# Patient Record
Sex: Male | Born: 1945 | Race: White | Hispanic: No | Marital: Married | Smoking: Former smoker
Health system: Southern US, Community
[De-identification: ages and names within clinical notes are randomized; demographics above are authoritative.]

## PROBLEM LIST (undated history)

## (undated) DIAGNOSIS — E119 Type 2 diabetes mellitus without complications: Secondary | ICD-10-CM

## (undated) DIAGNOSIS — I10 Essential (primary) hypertension: Secondary | ICD-10-CM

## (undated) DIAGNOSIS — M4800 Spinal stenosis, site unspecified: Secondary | ICD-10-CM

## (undated) DIAGNOSIS — M545 Low back pain, unspecified: Secondary | ICD-10-CM

## (undated) DIAGNOSIS — N183 Chronic kidney disease, stage 3 unspecified: Secondary | ICD-10-CM

## (undated) DIAGNOSIS — C439 Malignant melanoma of skin, unspecified: Secondary | ICD-10-CM

## (undated) DIAGNOSIS — E7800 Pure hypercholesterolemia, unspecified: Secondary | ICD-10-CM

## (undated) DIAGNOSIS — I700 Atherosclerosis of aorta: Secondary | ICD-10-CM

## (undated) DIAGNOSIS — K5790 Diverticulosis of intestine, part unspecified, without perforation or abscess without bleeding: Secondary | ICD-10-CM

## (undated) DIAGNOSIS — G479 Sleep disorder, unspecified: Secondary | ICD-10-CM

## (undated) DIAGNOSIS — H9313 Tinnitus, bilateral: Secondary | ICD-10-CM

## (undated) DIAGNOSIS — E1142 Type 2 diabetes mellitus with diabetic polyneuropathy: Secondary | ICD-10-CM

## (undated) DIAGNOSIS — F4310 Post-traumatic stress disorder, unspecified: Secondary | ICD-10-CM

## (undated) DIAGNOSIS — H18519 Endothelial corneal dystrophy, unspecified eye: Secondary | ICD-10-CM

## (undated) DIAGNOSIS — E11610 Type 2 diabetes mellitus with diabetic neuropathic arthropathy: Secondary | ICD-10-CM

## (undated) DIAGNOSIS — H905 Unspecified sensorineural hearing loss: Secondary | ICD-10-CM

## (undated) DIAGNOSIS — I4891 Unspecified atrial fibrillation: Secondary | ICD-10-CM

## (undated) DIAGNOSIS — E785 Hyperlipidemia, unspecified: Secondary | ICD-10-CM

## (undated) DIAGNOSIS — G629 Polyneuropathy, unspecified: Secondary | ICD-10-CM

## (undated) DIAGNOSIS — F1011 Alcohol abuse, in remission: Secondary | ICD-10-CM

## (undated) DIAGNOSIS — F1211 Cannabis abuse, in remission: Secondary | ICD-10-CM

## (undated) DIAGNOSIS — K219 Gastro-esophageal reflux disease without esophagitis: Secondary | ICD-10-CM

## (undated) DIAGNOSIS — J189 Pneumonia, unspecified organism: Secondary | ICD-10-CM

## (undated) HISTORY — PX: CORNEAL TRANSPLANT: SHX108

## (undated) HISTORY — PX: COLON SURGERY: SHX602

## (undated) HISTORY — PX: JOINT REPLACEMENT: SHX530

## (undated) HISTORY — PX: FOREIGN BODY REMOVAL: SHX962

## (undated) HISTORY — PX: HERNIA REPAIR: SHX51

## (undated) HISTORY — PX: CATARACT EXTRACTION: SUR2

## (undated) HISTORY — PX: ABLATION: SHX5711

## (undated) HISTORY — PX: APPENDECTOMY: SHX54

## (undated) HISTORY — PX: EYE SURGERY: SHX253

## (undated) HISTORY — PX: TONSILLECTOMY: SUR1361

---

## 1970-09-15 HISTORY — PX: APPENDECTOMY: SHX54

## 1990-09-15 HISTORY — PX: CARDIAC ELECTROPHYSIOLOGY STUDY AND ABLATION: SHX1294

## 1990-09-15 HISTORY — PX: ABLATION: SHX5711

## 2004-09-15 HISTORY — PX: UMBILICAL HERNIA REPAIR: SHX196

## 2004-09-15 HISTORY — PX: COLON SURGERY: SHX602

## 2004-09-15 HISTORY — PX: HERNIA REPAIR: SHX51

## 2004-09-15 HISTORY — PX: PARTIAL COLECTOMY: SHX5273

## 2008-09-15 HISTORY — PX: TOTAL SHOULDER REPLACEMENT: SUR1217

## 2016-03-24 ENCOUNTER — Emergency Department: Payer: Medicare Other

## 2016-03-24 ENCOUNTER — Emergency Department
Admission: EM | Admit: 2016-03-24 | Discharge: 2016-03-24 | Disposition: A | Discharge status: Home / Self Care | Payer: Medicare Other | Attending: Emergency Medicine | Admitting: Emergency Medicine

## 2016-03-24 ENCOUNTER — Encounter: Payer: Self-pay | Admitting: Emergency Medicine

## 2016-03-24 DIAGNOSIS — L03032 Cellulitis of left toe: Secondary | ICD-10-CM | POA: Insufficient documentation

## 2016-03-24 DIAGNOSIS — L97529 Non-pressure chronic ulcer of other part of left foot with unspecified severity: Secondary | ICD-10-CM | POA: Insufficient documentation

## 2016-03-24 DIAGNOSIS — E11621 Type 2 diabetes mellitus with foot ulcer: Secondary | ICD-10-CM | POA: Diagnosis not present

## 2016-03-24 DIAGNOSIS — Z87891 Personal history of nicotine dependence: Secondary | ICD-10-CM | POA: Insufficient documentation

## 2016-03-24 DIAGNOSIS — I10 Essential (primary) hypertension: Secondary | ICD-10-CM | POA: Insufficient documentation

## 2016-03-24 HISTORY — DX: Essential (primary) hypertension: I10

## 2016-03-24 HISTORY — DX: Type 2 diabetes mellitus without complications: E11.9

## 2016-03-24 HISTORY — DX: Post-traumatic stress disorder, unspecified: F43.10

## 2016-03-24 HISTORY — DX: Polyneuropathy, unspecified: G62.9

## 2016-03-24 LAB — CBC WITH DIFFERENTIAL/PLATELET
Basophils Absolute: 0.1 10*3/uL (ref 0–0.1)
Basophils Relative: 1 %
EOS ABS: 0.2 10*3/uL (ref 0–0.7)
Eosinophils Relative: 1 %
HCT: 40.9 % (ref 40.0–52.0)
HEMOGLOBIN: 14.6 g/dL (ref 13.0–18.0)
LYMPHS ABS: 2.7 10*3/uL (ref 1.0–3.6)
LYMPHS PCT: 21 %
MCH: 32 pg (ref 26.0–34.0)
MCHC: 35.7 g/dL (ref 32.0–36.0)
MCV: 89.8 fL (ref 80.0–100.0)
Monocytes Absolute: 1 10*3/uL (ref 0.2–1.0)
Monocytes Relative: 8 %
NEUTROS PCT: 69 %
Neutro Abs: 8.6 10*3/uL — ABNORMAL HIGH (ref 1.4–6.5)
PLATELETS: 214 10*3/uL (ref 150–440)
RBC: 4.55 MIL/uL (ref 4.40–5.90)
RDW: 12.4 % (ref 11.5–14.5)
WBC: 12.6 10*3/uL — AB (ref 3.8–10.6)

## 2016-03-24 LAB — COMPREHENSIVE METABOLIC PANEL
ALT: 26 U/L (ref 17–63)
ANION GAP: 11 (ref 5–15)
AST: 21 U/L (ref 15–41)
Albumin: 4.3 g/dL (ref 3.5–5.0)
Alkaline Phosphatase: 76 U/L (ref 38–126)
BUN: 27 mg/dL — ABNORMAL HIGH (ref 6–20)
CHLORIDE: 101 mmol/L (ref 101–111)
CO2: 23 mmol/L (ref 22–32)
Calcium: 9.4 mg/dL (ref 8.9–10.3)
Creatinine, Ser: 1.47 mg/dL — ABNORMAL HIGH (ref 0.61–1.24)
GFR, EST AFRICAN AMERICAN: 54 mL/min — AB (ref >60)
GFR, EST NON AFRICAN AMERICAN: 47 mL/min — AB (ref >60)
Glucose, Bld: 204 mg/dL — ABNORMAL HIGH (ref 65–99)
POTASSIUM: 3.5 mmol/L (ref 3.5–5.1)
Sodium: 135 mmol/L (ref 135–145)
Total Bilirubin: 1.7 mg/dL — ABNORMAL HIGH (ref 0.3–1.2)
Total Protein: 7.7 g/dL (ref 6.5–8.1)

## 2016-03-24 MED ORDER — CLINDAMYCIN HCL 150 MG PO CAPS
ORAL_CAPSULE | ORAL | Status: AC
Start: 2016-03-24 — End: 2016-03-24
  Administered 2016-03-24: 300 mg via ORAL
  Filled 2016-03-24: qty 2

## 2016-03-24 MED ORDER — CLINDAMYCIN HCL 300 MG PO CAPS
300.0000 mg | ORAL_CAPSULE | Freq: Once | ORAL | Status: AC
  Administered 2016-03-24: 300 mg via ORAL
  Filled 2016-03-24: qty 1

## 2016-03-24 MED ORDER — CLINDAMYCIN HCL 300 MG PO CAPS: 300.0000 mg | ORAL_CAPSULE | Freq: Three times a day (TID) | ORAL | Status: DC

## 2016-03-24 NOTE — ED Provider Notes (Signed)
[facility] Emergency Department Provider Note  Time seen: 3:48 PM  I have reviewed the triage vital signs and the nursing notes.   HISTORY  Chief Complaint Foot Injury    HPI Larry Cobb is a 70 y.o. male with a past medical history of diabetes, peripheral neuropathy, hypertension presents the emergency department with swelling and redness of the left third toe. According to the patient they noticed some redness last night to the left third toe. He states he cleaned the toe and placed Neosporin on the town covered in the bandage. This morning when he removed the pain and she said the redness had increased and he saw streaking up his foot and leg. Patient has diabetic neuropathy, states he has no feeling in his feet. Denies fever, nausea, vomiting.     Past Medical History  Diagnosis Date  . Diabetes mellitus without complication ([place])   . Hypertension   . Neuropathy ([place])   . PTSD (post-traumatic stress disorder)     There are no active problems to display for this patient.   Past Surgical History  Procedure Laterality Date  . Appendectomy    . Colon surgery    . Hernia repair    . Joint replacement      No current outpatient prescriptions on file.  Allergies Indocin  No family history on file.  Social History Social History  Substance Use Topics  . Smoking status: Former Research scientist (life sciences)  . Smokeless tobacco: None  . Alcohol Use: No    Review of Systems Constitutional: Negative for fever. Cardiovascular: Negative for chest pain. Respiratory: Negative for shortness of breath. Gastrointestinal: Negative for abdominal pain Musculoskeletal: Redness and swelling to left third toe. Skin: Redness and swelling to left third toe. Neurological: Negative for headache 10-point ROS otherwise negative.  ____________________________________________   PHYSICAL EXAM:  VITAL SIGNS: ED Triage Vitals  Enc Vitals Group     BP 03/24/16 1511  129/79 mmHg     Pulse Rate 03/24/16 1511 102     Resp 03/24/16 1511 18     Temp 03/24/16 1511 98.7 F (37.1 C)     Temp Source 03/24/16 1511 Oral     SpO2 03/24/16 1511 94 %     Weight 03/24/16 1511 315 lb (142.883 kg)     Height 03/24/16 1511 6\' 2"  (1.88 m)     Head Cir --      Peak Flow --      Pain Score 03/24/16 1512 5     Pain Loc --      Pain Edu? --      Excl. in [place]? --     Constitutional: Alert and oriented. Well appearing and in no distress. Eyes: Normal exam ENT   Head: Normocephalic and atraumatic.   Mouth/Throat: Mucous membranes are moist. Cardiovascular: Normal rate, regular rhythm. No murmur Respiratory: Normal respiratory effort without tachypnea nor retractions. Breath sounds are clear  Gastrointestinal: Soft and nontender. No distention. Musculoskeletal: Redness and swelling to left third toe, very small ulceration to the bottom of the left third toe. There is mild erythema/streaking consistent with lymphatic streaking through the foot. Minimal tenderness to palpation. Neurologic:  Normal speech and language. No gross focal neurologic deficits Skin:  Skin is warm, dry, erythema as described above. Psychiatric: Mood and affect are normal.   ____________________________________________   RADIOLOGY  X-ray consistent with swelling but no signs of osteomyelitis.  ____________________________________________   INITIAL IMPRESSION / ASSESSMENT AND PLAN / ED COURSE  Pertinent labs & imaging results that were available during my care of the patient were reviewed by me and considered in my medical decision making (see chart for details).  The patient presents the emergency department with a left third toe that is swollen and red consistent with likely diabetic foot ulcer/infection. We'll obtain an x-ray to rule out osteomyelitis. We'll obtain lab work. Patient will need to be placed on antibiotics.  X-ray negative for osteomyelitis. We'll place the patient  on clindamycin 3 times a day, and have the patient follow up with a primary care doctor soon as possible. I discussed strict return precautions for any increased redness, swelling or fever.  ____________________________________________   FINAL CLINICAL IMPRESSION(S) / ED DIAGNOSES  Diabetic foot ulcer Cellulitis   Harvest Dark, MD 03/24/16 [phone]

## 2016-03-24 NOTE — ED Notes (Signed)
Pt discharged to home.  Family member driving.  Discharge instructions reviewed.  Verbalized understanding.  No questions or concerns at this time.  Teach back verified.  Pt in NAD.  No items left in ED.   

## 2016-03-24 NOTE — ED Notes (Signed)
Patient presents to the ED with pain and redness to his 3rd toe on the left foot with red streaking across foot.  Patient states he first noticed wound yesterday and reports history of diabetes.  Patient is in no obvious distress at this time.  Ambulatory to triage.  Patient denies skin on toe is broken.

## 2016-03-24 NOTE — Discharge Instructions (Signed)

## 2016-03-27 ENCOUNTER — Emergency Department
Admission: EM | Admit: 2016-03-27 | Discharge: 2016-03-27 | Disposition: A | Discharge status: Home / Self Care | Payer: Medicare Other | Attending: Emergency Medicine | Admitting: Emergency Medicine

## 2016-03-27 ENCOUNTER — Encounter: Payer: Self-pay | Admitting: Emergency Medicine

## 2016-03-27 ENCOUNTER — Emergency Department: Payer: Medicare Other

## 2016-03-27 DIAGNOSIS — I10 Essential (primary) hypertension: Secondary | ICD-10-CM | POA: Insufficient documentation

## 2016-03-27 DIAGNOSIS — L03032 Cellulitis of left toe: Secondary | ICD-10-CM | POA: Diagnosis not present

## 2016-03-27 DIAGNOSIS — E119 Type 2 diabetes mellitus without complications: Secondary | ICD-10-CM | POA: Insufficient documentation

## 2016-03-27 DIAGNOSIS — Z87891 Personal history of nicotine dependence: Secondary | ICD-10-CM | POA: Insufficient documentation

## 2016-03-27 LAB — SEDIMENTATION RATE: SED RATE: 46 mm/h — AB (ref 0–20)

## 2016-03-27 LAB — GLUCOSE, CAPILLARY: GLUCOSE-CAPILLARY: 222 mg/dL — AB (ref 65–99)

## 2016-03-27 LAB — CBC WITH DIFFERENTIAL/PLATELET
BASOS PCT: 2 %
Basophils Absolute: 0.1 10*3/uL (ref 0–0.1)
EOS ABS: 0.3 10*3/uL (ref 0–0.7)
Eosinophils Relative: 3 %
HCT: 39.9 % — ABNORMAL LOW (ref 40.0–52.0)
HEMOGLOBIN: 14.2 g/dL (ref 13.0–18.0)
LYMPHS ABS: 2.6 10*3/uL (ref 1.0–3.6)
Lymphocytes Relative: 26 %
MCH: 32 pg (ref 26.0–34.0)
MCHC: 35.5 g/dL (ref 32.0–36.0)
MCV: 90.1 fL (ref 80.0–100.0)
Monocytes Absolute: 0.9 10*3/uL (ref 0.2–1.0)
Monocytes Relative: 9 %
NEUTROS ABS: 6 10*3/uL (ref 1.4–6.5)
NEUTROS PCT: 60 %
Platelets: 227 10*3/uL (ref 150–440)
RBC: 4.43 MIL/uL (ref 4.40–5.90)
RDW: 12.8 % (ref 11.5–14.5)
WBC: 10 10*3/uL (ref 3.8–10.6)

## 2016-03-27 LAB — BASIC METABOLIC PANEL
ANION GAP: 8 (ref 5–15)
BUN: 27 mg/dL — ABNORMAL HIGH (ref 6–20)
CHLORIDE: 101 mmol/L (ref 101–111)
CO2: 26 mmol/L (ref 22–32)
CREATININE: 1.4 mg/dL — AB (ref 0.61–1.24)
Calcium: 8.9 mg/dL (ref 8.9–10.3)
GFR calc non Af Amer: 50 mL/min — ABNORMAL LOW (ref >60)
GFR, EST AFRICAN AMERICAN: 58 mL/min — AB (ref >60)
Glucose, Bld: 232 mg/dL — ABNORMAL HIGH (ref 65–99)
Potassium: 3.6 mmol/L (ref 3.5–5.1)
SODIUM: 135 mmol/L (ref 135–145)

## 2016-03-27 MED ORDER — SODIUM CHLORIDE 0.9 % IV BOLUS (SEPSIS)
1000.0000 mL | Freq: Once | INTRAVENOUS | Status: AC
  Administered 2016-03-27: 1000 mL via INTRAVENOUS

## 2016-03-27 NOTE — ED Notes (Signed)
Pt states was seen Monday for infection of middle toe on left foot and given antibiotics. Pt states that it is not getting better and pt now has red streaks on top of foot. Pt is diabetic.

## 2016-03-27 NOTE — ED Provider Notes (Signed)
[facility] Emergency Department Provider Note  ____________________________________________  Time seen: 4:10 PM  I have reviewed the triage vital signs and the nursing notes.   HISTORY  Chief Complaint Wound Infection    HPI Larry Cobb is a 70 y.o. male with diabetes who reports for recheck of an infected toe. He was seen in the emergency department 3 days ago and found to have cellulitis of the left third toe with lymphangitis up the foot and lower leg. He was started on clindamycin which she's been taking for the last several days. He notes that the streaking up the leg has receded significantly and is only a small amount of streaking on the top of the foot. Denies fever chills or sweats. Normal oral intake. No dizziness. No change in symptoms.  He does report that over the last 2 days until has become more swollen to the point that the superficial most lab her skin has split open. Denies purulent drainage. He has been wearing sneakers this whole time.  Just moved to this area from [place] state, doesn't have a primary care doctor in the area yet.     Past Medical History  Diagnosis Date  . Diabetes mellitus without complication ([place])   . Hypertension   . Neuropathy ([place])   . PTSD (post-traumatic stress disorder)      There are no active problems to display for this patient.    Past Surgical History  Procedure Laterality Date  . Appendectomy    . Colon surgery    . Hernia repair    . Joint replacement    . Ablation       Current Outpatient Rx  Name  Route  Sig  Dispense  Refill  . clindamycin (CLEOCIN) 300 MG capsule   Oral   Take 1 capsule (300 mg total) by mouth 3 (three) times daily.   30 capsule   0      Allergies Indocin   No family history on file.  Social History Social History  Substance Use Topics  . Smoking status: Former Research scientist (life sciences)  . Smokeless tobacco: None  . Alcohol Use: No    Review of  Systems  Constitutional:   No fever or chills.  ENT:   No sore throat. No rhinorrhea. Cardiovascular:   No chest pain. Respiratory:   No dyspnea or cough. Gastrointestinal:   Negative for abdominal pain, vomiting and diarrhea.  Musculoskeletal:   Negative for focal pain, Positive swelling of the left foot and third toe  10-point ROS otherwise negative.  ____________________________________________   PHYSICAL EXAM:  VITAL SIGNS: ED Triage Vitals  Enc Vitals Group     BP 03/27/16 1522 118/64 mmHg     Pulse Rate 03/27/16 1522 88     Resp 03/27/16 1522 18     Temp 03/27/16 1522 98.7 F (37.1 C)     Temp Source 03/27/16 1522 Oral     SpO2 03/27/16 1522 95 %     Weight 03/27/16 1522 315 lb (142.883 kg)     Height 03/27/16 1522 6' 2"  (1.88 m)     Head Cir --      Peak Flow --      Pain Score 03/27/16 1523 0     Pain Loc --      Pain Edu? --      Excl. in [place]? --     Vital signs reviewed, nursing assessments reviewed.   Constitutional:   Alert and oriented. Well appearing  and in no distress. Eyes:   No scleral icterus. No conjunctival pallor.  ENT   Head:   Normocephalic and atraumatic. Cardiovascular: Regular rate and rhythm, rate of 90. Good peripheral dorsalis pedis pulses and capillary refill.   Musculoskeletal:   Nontender with normal range of motion in all extremities. There is swelling of the left third toe with erythema. No crepitus. There is desquamation of the distal toe epidermis without purulent drainage. No open ulceration or bone exposure. No bony tenderness  Neurologic:   Normal speech and language.  CN 2-10 normal. Motor grossly intact. Chronic loss of sensation of bilateral lower extremities, symmetric, from peripheral neuropathy No gross focal neurologic deficits are appreciated.  Skin:    Skin is warm, dry with skin wound as noted above and lymphangitis on the top of the foot.. Scant amount of clear fluid drainage from the skin wound can be  expressed  ____________________________________________    LABS (pertinent positives/negatives) (all labs ordered are listed, but only abnormal results are displayed) Labs Reviewed  BASIC METABOLIC PANEL - Abnormal; Notable for the following:    Glucose, Bld 232 (*)    BUN 27 (*)    Creatinine, Ser 1.40 (*)    GFR calc non Af Amer 50 (*)    GFR calc Af Amer 58 (*)    All other components within normal limits  CBC WITH DIFFERENTIAL/PLATELET - Abnormal; Notable for the following:    HCT 39.9 (*)    All other components within normal limits  SEDIMENTATION RATE - Abnormal; Notable for the following:    Sed Rate 46 (*)    All other components within normal limits  GLUCOSE, CAPILLARY - Abnormal; Notable for the following:    Glucose-Capillary 222 (*)    All other components within normal limits  AEROBIC CULTURE (SUPERFICIAL SPECIMEN)   ____________________________________________   EKG    ____________________________________________    RADIOLOGY  X-ray left foot unremarkable  ____________________________________________   PROCEDURES   ____________________________________________   INITIAL IMPRESSION / ASSESSMENT AND PLAN / ED COURSE  Pertinent labs & imaging results that were available during my care of the patient were reviewed by me and considered in my medical decision making (see chart for details).  Patient well appearing no acute distress. By his account, the lymphangitis is improving and the clindamycin is working. He presents mainly for concern over the swelling of the splitting of the skin that he is noticed. Likely this is a combination of edema from ongoing inflammation in the area from the immune response as well as repetitive microtrauma from tennis shoe used during this time. Recheck labs and x-ray to reassess the clinical status. Try to obtain a sample for culture.  ----------------------------------------- 6:16 PM on  03/27/2016 -----------------------------------------  Patient feeling well. Vital signs stable. Workup negative. White blood cell count normal. ESR is slightly elevated which is expected. Based on history and clinical data, cellulitis appears to be responding to clindamycin and is improving. No evidence of abscess or necrotizing fasciitis or osteomyelitis. Wound culture sent. Follow-up with primary care and podiatry. Wound care supplies provided.     ____________________________________________   FINAL CLINICAL IMPRESSION(S) / ED DIAGNOSES  Final diagnoses:  Cellulitis of toe of left foot       Portions of this note were generated with dragon dictation software. Dictation errors may occur despite best attempts at proofreading.   Carrie Mew, MD 03/27/16 [phone]

## 2016-03-27 NOTE — Discharge Instructions (Signed)

## 2016-03-30 LAB — AEROBIC CULTURE W GRAM STAIN (SUPERFICIAL SPECIMEN)

## 2016-03-30 LAB — AEROBIC CULTURE  (SUPERFICIAL SPECIMEN)

## 2016-10-21 HISTORY — PX: CORNEAL TRANSPLANT: SHX108

## 2016-10-21 HISTORY — PX: CATARACT EXTRACTION: SUR2

## 2016-12-21 ENCOUNTER — Encounter: Payer: Self-pay | Admitting: Emergency Medicine

## 2016-12-21 ENCOUNTER — Emergency Department
Admission: EM | Admit: 2016-12-21 | Discharge: 2016-12-21 | Disposition: A | Discharge status: Home / Self Care | Payer: Medicare Other | Attending: Emergency Medicine | Admitting: Emergency Medicine

## 2016-12-21 DIAGNOSIS — Z87891 Personal history of nicotine dependence: Secondary | ICD-10-CM | POA: Diagnosis not present

## 2016-12-21 DIAGNOSIS — E1165 Type 2 diabetes mellitus with hyperglycemia: Secondary | ICD-10-CM | POA: Insufficient documentation

## 2016-12-21 DIAGNOSIS — R739 Hyperglycemia, unspecified: Secondary | ICD-10-CM

## 2016-12-21 LAB — GLUCOSE, CAPILLARY: Glucose-Capillary: 257 mg/dL — ABNORMAL HIGH (ref 65–99)

## 2016-12-21 NOTE — ED Provider Notes (Signed)
[facility] Emergency Department Provider Note  ____________________________________________  Time seen: Approximately 4:07 PM  I have reviewed the triage vital signs and the nursing notes.   HISTORY  Chief Complaint Hyperglycemia   HPI Larry Cobb is a 71 y.o. male who presents to the emergency department for evaluation of hyperglycemia. He states that he checked his blood sugar randomly today and it was 360. He states that he isn't sure what caused him to think to check it. He denies symptoms or concerns other than the reading on his machine. He has not been following a diabetic diet. In fact, he at steak, eggs, "lots of potatoes," toast, and grits for breakfast this morning. His last A1C was 7.5 and his metformin was increased to 850mg  3 times per day. [facility] doctor asked him to check his blood sugar weekly, but he has not done so until today.   Past Medical History:  Diagnosis Date  . Diabetes mellitus without complication ([place])   . Hypertension   . Neuropathy ([place])   . PTSD (post-traumatic stress disorder)     There are no active problems to display for this patient.   Past Surgical History:  Procedure Laterality Date  . ABLATION    . APPENDECTOMY    . CATARACT EXTRACTION    . COLON SURGERY    . CORNEAL TRANSPLANT    . HERNIA REPAIR    . JOINT REPLACEMENT      Prior to Admission medications   Medication Sig Start Date End Date Taking? Authorizing Provider  clindamycin (CLEOCIN) 300 MG capsule Take 1 capsule (300 mg total) by mouth 3 (three) times daily. 03/24/16   Harvest Dark, MD    Allergies Indocin [indomethacin]  No family history on file.  Social History Social History  Substance Use Topics  . Smoking status: Former Research scientist (life sciences)  . Smokeless tobacco: Not on file  . Alcohol use No    Review of Systems Constitutional: Negtaive for fever or recent illness. No increase in thirst. ENT: Negative for dry mouth. Respiratory:  Negative for cough or shortness of breath Gastrointestinal: No abdominal pain.  No nausea, no vomiting. Genitourinary: No increase in frequency of urination. Musculoskeletal: Negataive for generalized body aches. Skin: Negative for rash/lesion/wound. Neurological: Negative for headaches, focal weakness or numbness.  ____________________________________________   PHYSICAL EXAM:  VITAL SIGNS: ED Triage Vitals  Enc Vitals Group     BP 12/21/16 1543 139/82     Pulse Rate 12/21/16 1543 94     Resp 12/21/16 1543 18     Temp 12/21/16 1543 98.1 F (36.7 C)     Temp Source 12/21/16 1543 Oral     SpO2 12/21/16 1543 94 %     Weight 12/21/16 1544 (!) 340 lb (154.2 kg)     Height 12/21/16 1544 6\' 2"  (1.88 m)     Head Circumference --      Peak Flow --      Pain Score --      Pain Loc --      Pain Edu? --      Excl. in [place]? --     Constitutional: Alert and oriented. Well appearing and in no acute distress. Eyes: Conjunctivae are normal. EOMI. Head: Atraumatic. Nose: No congestion/rhinnorhea. Mouth/Throat: Mucous membranes are moist. Neck: No stridor.  Cardiovascular: Good peripheral circulation. Respiratory: Normal respiratory effort. Musculoskeletal: Full ROM throughout.  Neurologic:  Normal speech and language. No gross focal neurologic deficits are appreciated. Speech is normal. No  gait instability. Skin:  Skin is warm, dry and intact. No rash noted. Psychiatric: Mood and affect are normal. Speech and behavior are normal.  ____________________________________________   LABS (all labs ordered are listed, but only abnormal results are displayed)  Labs Reviewed - No data to display ____________________________________________  EKG  Not indicated. ____________________________________________  RADIOLOGY  Not indicated. ____________________________________________   PROCEDURES  None ____________________________________________   INITIAL IMPRESSION / ASSESSMENT AND  PLAN / ED COURSE  71 year old male presenting to the emergency department for concerns after his blood glucose meter read 360 at home. Previous glucose results documented in his chart show a trend around 250 over the past year or so. With the amount of carbohydrates that he ate this morning, it is not surprising that his blood sugar was 360. He has been and continues to be completely asymptomatic today.   I spent about 15 minutes with him and his wife educating them about diabetes and the proper diet. They were also given strict ER return precautions and he was advised to check his blood glucose every morning before breakfast, keep a log, and schedule a follow-up appointment with his [place] doctor.   Pertinent labs & imaging results that were available during my care of the patient were reviewed by me and considered in my medical decision making (see chart for details). ____________________________________________   FINAL CLINICAL IMPRESSION(S) / ED DIAGNOSES  Final diagnoses:  Hyperglycemia       Victorino Dike, FNP 12/21/16 1657    Delman Kitten, MD 12/21/16 2356

## 2016-12-21 NOTE — ED Triage Notes (Signed)
Pt presents to ED with c/o hyperglycemia. Pt denies any symptoms, no dizziness, no chest pain, no shortness of breath, no syncope at this time. Pt states "I feel fine".

## 2016-12-21 NOTE — Discharge Instructions (Signed)
Avoid "white" foods such as white bread, white rice, white pasta, white sugar, etc...  Check your blood sugar in the mornings and keep a log to show your primary care doctor at your next visit.  Read over the enclosed instructions and return to the ER for anything of concern.

## 2016-12-21 NOTE — ED Notes (Signed)
CBG 257 in triage

## 2017-09-04 ENCOUNTER — Other Ambulatory Visit: Payer: Self-pay | Admitting: Internal Medicine

## 2017-09-04 DIAGNOSIS — R1905 Periumbilic swelling, mass or lump: Secondary | ICD-10-CM

## 2017-09-09 ENCOUNTER — Inpatient Hospital Stay
Admission: AD | Admit: 2017-09-09 | Discharge: 2017-09-12 | DRG: 256 | Disposition: A | Discharge status: Home / Self Care | Payer: Medicare Other | Source: Ambulatory Visit | Attending: Internal Medicine | Admitting: Internal Medicine

## 2017-09-09 ENCOUNTER — Inpatient Hospital Stay: Payer: Medicare Other

## 2017-09-09 ENCOUNTER — Other Ambulatory Visit: Payer: Self-pay

## 2017-09-09 DIAGNOSIS — Z7984 Long term (current) use of oral hypoglycemic drugs: Secondary | ICD-10-CM | POA: Diagnosis not present

## 2017-09-09 DIAGNOSIS — E1152 Type 2 diabetes mellitus with diabetic peripheral angiopathy with gangrene: Principal | ICD-10-CM | POA: Diagnosis present

## 2017-09-09 DIAGNOSIS — F4310 Post-traumatic stress disorder, unspecified: Secondary | ICD-10-CM | POA: Diagnosis present

## 2017-09-09 DIAGNOSIS — L089 Local infection of the skin and subcutaneous tissue, unspecified: Secondary | ICD-10-CM | POA: Diagnosis present

## 2017-09-09 DIAGNOSIS — I10 Essential (primary) hypertension: Secondary | ICD-10-CM | POA: Diagnosis present

## 2017-09-09 DIAGNOSIS — Z87891 Personal history of nicotine dependence: Secondary | ICD-10-CM | POA: Diagnosis not present

## 2017-09-09 DIAGNOSIS — E1140 Type 2 diabetes mellitus with diabetic neuropathy, unspecified: Secondary | ICD-10-CM | POA: Diagnosis present

## 2017-09-09 DIAGNOSIS — Z833 Family history of diabetes mellitus: Secondary | ICD-10-CM

## 2017-09-09 DIAGNOSIS — Z6838 Body mass index (BMI) 38.0-38.9, adult: Secondary | ICD-10-CM

## 2017-09-09 DIAGNOSIS — E1165 Type 2 diabetes mellitus with hyperglycemia: Secondary | ICD-10-CM | POA: Diagnosis present

## 2017-09-09 DIAGNOSIS — M86171 Other acute osteomyelitis, right ankle and foot: Secondary | ICD-10-CM | POA: Diagnosis present

## 2017-09-09 DIAGNOSIS — E1169 Type 2 diabetes mellitus with other specified complication: Secondary | ICD-10-CM | POA: Diagnosis present

## 2017-09-09 DIAGNOSIS — Z7982 Long term (current) use of aspirin: Secondary | ICD-10-CM

## 2017-09-09 DIAGNOSIS — M869 Osteomyelitis, unspecified: Secondary | ICD-10-CM

## 2017-09-09 LAB — GLUCOSE, CAPILLARY
GLUCOSE-CAPILLARY: 185 mg/dL — AB (ref 65–99)
Glucose-Capillary: 188 mg/dL — ABNORMAL HIGH (ref 65–99)

## 2017-09-09 LAB — BASIC METABOLIC PANEL
Anion gap: 13 (ref 5–15)
BUN: 18 mg/dL (ref 6–20)
CO2: 21 mmol/L — ABNORMAL LOW (ref 22–32)
CREATININE: 1.36 mg/dL — AB (ref 0.61–1.24)
Calcium: 8.9 mg/dL (ref 8.9–10.3)
Chloride: 96 mmol/L — ABNORMAL LOW (ref 101–111)
GFR calc Af Amer: 59 mL/min — ABNORMAL LOW (ref >60)
GFR, EST NON AFRICAN AMERICAN: 51 mL/min — AB (ref >60)
GLUCOSE: 184 mg/dL — AB (ref 65–99)
Potassium: 3.4 mmol/L — ABNORMAL LOW (ref 3.5–5.1)
SODIUM: 130 mmol/L — AB (ref 135–145)

## 2017-09-09 LAB — CBC
HEMATOCRIT: 40.3 % (ref 40.0–52.0)
HEMOGLOBIN: 13.7 g/dL (ref 13.0–18.0)
MCH: 30.1 pg (ref 26.0–34.0)
MCHC: 33.9 g/dL (ref 32.0–36.0)
MCV: 88.7 fL (ref 80.0–100.0)
PLATELETS: 181 10*3/uL (ref 150–440)
RBC: 4.54 MIL/uL (ref 4.40–5.90)
RDW: 13.3 % (ref 11.5–14.5)
WBC: 15.3 10*3/uL — AB (ref 3.8–10.6)

## 2017-09-09 MED ORDER — ONDANSETRON HCL 4 MG PO TABS: 4.0000 mg | ORAL_TABLET | Freq: Four times a day (QID) | ORAL | Status: DC | PRN

## 2017-09-09 MED ORDER — ACETAMINOPHEN 650 MG RE SUPP: 650.0000 mg | Freq: Four times a day (QID) | RECTAL | Status: DC | PRN

## 2017-09-09 MED ORDER — ATORVASTATIN CALCIUM 20 MG PO TABS
40.0000 mg | ORAL_TABLET | Freq: Every day | ORAL | Status: DC
  Administered 2017-09-09 – 2017-09-11 (×3): 40 mg via ORAL
  Filled 2017-09-09 (×3): qty 2

## 2017-09-09 MED ORDER — PIPERACILLIN-TAZOBACTAM 3.375 G IVPB
3.3750 g | Freq: Three times a day (TID) | INTRAVENOUS | Status: DC
  Administered 2017-09-10 – 2017-09-12 (×7): 3.375 g via INTRAVENOUS
  Filled 2017-09-09 (×8): qty 50

## 2017-09-09 MED ORDER — INSULIN ASPART 100 UNIT/ML ~~LOC~~ SOLN
0.0000 [IU] | Freq: Three times a day (TID) | SUBCUTANEOUS | Status: DC
  Administered 2017-09-09 – 2017-09-10 (×3): 2 [IU] via SUBCUTANEOUS
  Filled 2017-09-09 (×3): qty 1

## 2017-09-09 MED ORDER — SODIUM CHLORIDE 0.9 % IV SOLN
INTRAVENOUS | Status: DC
  Administered 2017-09-09 – 2017-09-11 (×3): via INTRAVENOUS

## 2017-09-09 MED ORDER — ONDANSETRON HCL 4 MG/2ML IJ SOLN: 4.0000 mg | Freq: Four times a day (QID) | INTRAMUSCULAR | Status: DC | PRN

## 2017-09-09 MED ORDER — PIPERACILLIN-TAZOBACTAM 3.375 G IVPB
3.3750 g | Freq: Once | INTRAVENOUS | Status: AC
  Administered 2017-09-09: 3.375 g via INTRAVENOUS
  Filled 2017-09-09: qty 50

## 2017-09-09 MED ORDER — TRAMADOL HCL 50 MG PO TABS: 50.0000 mg | ORAL_TABLET | Freq: Four times a day (QID) | ORAL | Status: DC | PRN

## 2017-09-09 MED ORDER — AMLODIPINE BESYLATE 5 MG PO TABS
5.0000 mg | ORAL_TABLET | Freq: Every day | ORAL | Status: DC
  Administered 2017-09-09 – 2017-09-12 (×4): 5 mg via ORAL
  Filled 2017-09-09 (×4): qty 1

## 2017-09-09 MED ORDER — VANCOMYCIN HCL 10 G IV SOLR
1250.0000 mg | Freq: Two times a day (BID) | INTRAVENOUS | Status: DC
  Administered 2017-09-10 – 2017-09-11 (×4): 1250 mg via INTRAVENOUS
  Filled 2017-09-09 (×5): qty 1250

## 2017-09-09 MED ORDER — HYDRALAZINE HCL 20 MG/ML IJ SOLN: 10.0000 mg | Freq: Four times a day (QID) | INTRAMUSCULAR | Status: DC | PRN

## 2017-09-09 MED ORDER — VANCOMYCIN HCL 10 G IV SOLR
1500.0000 mg | Freq: Once | INTRAVENOUS | Status: AC
  Administered 2017-09-09: 1500 mg via INTRAVENOUS
  Filled 2017-09-09: qty 1500

## 2017-09-09 MED ORDER — SENNOSIDES-DOCUSATE SODIUM 8.6-50 MG PO TABS: 1.0000 | ORAL_TABLET | Freq: Every evening | ORAL | Status: DC | PRN

## 2017-09-09 MED ORDER — ACETAMINOPHEN 325 MG PO TABS
650.0000 mg | ORAL_TABLET | Freq: Four times a day (QID) | ORAL | Status: DC | PRN
  Administered 2017-09-09: 650 mg via ORAL
  Filled 2017-09-09: qty 2

## 2017-09-09 MED ORDER — ENOXAPARIN SODIUM 40 MG/0.4ML ~~LOC~~ SOLN
40.0000 mg | SUBCUTANEOUS | Status: DC
  Administered 2017-09-09 – 2017-09-11 (×3): 40 mg via SUBCUTANEOUS
  Filled 2017-09-09 (×3): qty 0.4

## 2017-09-09 MED ORDER — LAMOTRIGINE 25 MG PO TABS
150.0000 mg | ORAL_TABLET | Freq: Two times a day (BID) | ORAL | Status: DC
  Administered 2017-09-09 – 2017-09-12 (×6): 150 mg via ORAL
  Filled 2017-09-09 (×6): qty 1

## 2017-09-09 NOTE — H&P (Signed)
[name] [place] NAME: Larry Cobb    MR#:  [id number]  DATE OF BIRTH:  11-Sep-1946  DATE OF ADMISSION:  09/09/2017  PRIMARY CARE PHYSICIAN: Baxter Hire, MD   REQUESTING/REFERRING PHYSICIAN: Dr. Wynetta Emery  CHIEF COMPLAINT:   Foot infection HISTORY OF PRESENT ILLNESS:  Larry Cobb  is a 71 y.o. male with a known history of PTSD, diabetic neuropathy and diabetes who presents from PCP office with concerns of right foot infection. Patient has neuropathy and is unable to feel the bottom of his foot. He noticed a few days ago of a pain sensation of his right foot. He does not recall stepping on anything. The right second toe is purple and has evidence of erythema extending towards the ankle. He has also had fevers over the past 2 days.  PAST MEDICAL HISTORY:   Past Medical History:  Diagnosis Date  . Diabetes mellitus without complication ([place])   . Hypertension   . Neuropathy   . PTSD (post-traumatic stress disorder)     PAST SURGICAL HISTORY:   Past Surgical History:  Procedure Laterality Date  . ABLATION    . APPENDECTOMY    . CATARACT EXTRACTION    . COLON SURGERY    . CORNEAL TRANSPLANT    . HERNIA REPAIR    . JOINT REPLACEMENT      SOCIAL HISTORY:   Social History   Tobacco Use  . Smoking status: Former Smoker  Substance Use Topics  . Alcohol use: No    FAMILY HISTORY:  Diabetes  DRUG ALLERGIES:   Allergies  Allergen Reactions  . Indocin [Indomethacin] Other (See Comments)    "I bleed through my skin"    REVIEW OF SYSTEMS:   Review of Systems  Constitutional: Positive for fever, malaise/fatigue and weight loss. Negative for chills.  HENT: Negative.  Negative for ear discharge, ear pain, hearing loss, nosebleeds and sore throat.   Eyes: Negative.  Negative for blurred vision and pain.  Respiratory: Negative.  Negative for cough, hemoptysis, shortness of breath and wheezing.   Cardiovascular:  Negative.  Negative for chest pain, palpitations and leg swelling.  Gastrointestinal: Negative.  Negative for abdominal pain, blood in stool, diarrhea, nausea and vomiting.  Genitourinary: Negative.  Negative for dysuria.  Musculoskeletal: Negative.  Negative for back pain.  Skin: Negative.        Right foot infection   Neurological: Positive for weakness. Negative for dizziness, tremors, speech change, focal weakness, seizures and headaches.  Endo/Heme/Allergies: Negative.  Does not bruise/bleed easily.  Psychiatric/Behavioral: Negative.  Negative for depression, hallucinations and suicidal ideas.    MEDICATIONS AT HOME:   Norvasc 5 mg daily Trazodone 200 mg at bedtime Lamigtal 150 twice a day  Metformin 850 3 times a day Maxide 75/50 daily Atorvastatin 40 daily Aspirin 81 mg daily  VITAL SIGNS:  Pulse (!) 102, temperature (!) 101.2 F (38.4 C), temperature source Oral, resp. rate 18, SpO2 96 %.  PHYSICAL EXAMINATION:   Physical Exam  Constitutional: He is oriented to person, place, and time and well-developed, well-nourished, and in no distress. No distress.  HENT:  Head: Normocephalic.  Eyes: No scleral icterus.  Neck: Normal range of motion. Neck supple. No JVD present. No tracheal deviation present.  Cardiovascular: Normal rate, regular rhythm and normal heart sounds. Exam reveals no gallop and no friction rub.  No murmur heard. Pulmonary/Chest: Effort normal and breath sounds normal. No respiratory distress. He has no wheezes. He  has no rales. He exhibits no tenderness.  Abdominal: Soft. Bowel sounds are normal. He exhibits no distension and no mass. There is no tenderness. There is no rebound and no guarding.  Musculoskeletal: Normal range of motion. He exhibits no edema.  Neurological: He is alert and oriented to person, place, and time.  Skin: Skin is warm. No rash noted. No erythema.  2nd toe right foot purple with cellulitis and edema traveling up towards the ankle   Psychiatric: Affect and judgment normal.      LABORATORY PANEL:   CBC No results for input(s): WBC, HGB, HCT, PLT in the last 168 hours. ------------------------------------------------------------------------------------------------------------------  Chemistries  No results for input(s): NA, K, CL, CO2, GLUCOSE, BUN, CREATININE, CALCIUM, MG, AST, ALT, ALKPHOS, BILITOT in the last 168 hours.  Invalid input(s): GFRCGP ------------------------------------------------------------------------------------------------------------------  Cardiac Enzymes No results for input(s): TROPONINI in the last 168 hours. ------------------------------------------------------------------------------------------------------------------  RADIOLOGY:  No results found.  EKG:  No orders found for this or any previous visit.  IMPRESSION AND PLAN:   71 year old male with history of diabetes and diabetic neuropathy who was sent from his PCP office with concerns of diabetic foot infection.  1. Diabetic foot infection with probable abscess Podiatry consultation requested X-ray of right foot ordered to evaluate for underlying osteomyelitis however may need MRI Vancomycin and Zosyn ordered Follow-up on labs 2. Diabetes: Continue sliding scale with ADA diet Diabetes nurse consultation requested 3. Essential hypertension: Continue Norvasc  4. PTSD: Continue trazodone and Lamictal     All the records are reviewed and case discussed with ED provider. Management plans discussed with the patient and he is  in agreement  CODE STATUS: Full  TOTAL TIME TAKING CARE OF THIS PATIENT: 41  minutes.    Arrington Yohe M.D on 09/09/2017 at 4:56 PM  Between 7am to 6pm - Pager - [phone]  After 6pm go to [email or web address] - password [email or web address] [facility]  Office  [phone]  CC: Primary care physician; Baxter Hire, MD

## 2017-09-09 NOTE — Progress Notes (Signed)
Chaplain received an OR for pt who wanted to complete Advanced Directive. [place] met pt and wife at beside. Pt stated he had a rough day and asked chaplain to return tomorrow to educate him about Advanced Directive. CH did not leave the AD material with pt because he plans to turn the case over to the on-call chaplain for follow up when the shift change tomorrow morning.    09/09/17 1800  Clinical Encounter Type  Visited With Patient;Patient and family together  Visit Type Initial;Other (Comment)  Referral From Nurse  Consult/Referral To Chaplain  Spiritual Encounters  Spiritual Needs Literature;Brochure;Other (Comment)

## 2017-09-09 NOTE — Progress Notes (Signed)
ANTIBIOTIC CONSULT NOTE - INITIAL  Pharmacy Consult for vancomycin and Zosyn Indication: wound infection  Allergies  Allergen Reactions  . Indocin [Indomethacin] Other (See Comments)    "I bleed through my skin"    Patient Measurements:   Adjusted Body Weight: 102.9 kg  Vital Signs: Temp: 101.2 F (38.4 C) (12/26 1500) Temp Source: Oral (12/26 1500) Pulse Rate: 102 (12/26 1500) Intake/Output from previous day: No intake/output data recorded. Intake/Output from this shift: No intake/output data recorded.  Labs: No results for input(s): WBC, HGB, PLT, LABCREA, CREATININE in the last 72 hours. CrCl cannot be calculated (Patient's most recent lab result is older than the maximum 21 days allowed.). No results for input(s): VANCOTROUGH, VANCOPEAK, VANCORANDOM, GENTTROUGH, GENTPEAK, GENTRANDOM, TOBRATROUGH, TOBRAPEAK, TOBRARND, AMIKACINPEAK, AMIKACINTROU, AMIKACIN in the last 72 hours.   Microbiology: No results found for this or any previous visit (from the past 720 hour(s)).  Medical History: Past Medical History:  Diagnosis Date  . Diabetes mellitus without complication ([place])   . Hypertension   . Neuropathy   . PTSD (post-traumatic stress disorder)     Medications:  Infusions:  . sodium chloride     Assessment: 71 yom cc foot infection. PMH PTSD, DM neuropathy, DM. Direct admit from PCP d/t right foot infection. Pharmacy consulted to dose vancomycin and Zosyn for wound infection  Goal of Therapy:  Vancomycin trough level 15-20 mcg/ml  Plan:  I asked RN to update height and weight ASAP due to weight based drug dosing. Will dose when more information is available.   17:25 RN updated height and weight. First doses vancomycin 1.5 gm IV x 1 and Zosyn 3.375 gm IV x 1 released. Will schedule maintenance doses once labs return.   19:00 SCr returned 1.36 mg/dL. Continue Zosyn 3.375 gm IV Q8H EI and vancomycin 1.25 gm IV Q12H (starting 09/10/17 at 02:00), predicted trough  15 mcg/mL. Pharmacy will continue to follow and adjust as needed to maintain trough 15 to 20 mcg/ml.   Vd 72 L, Ke 0.065 hr-1, T1/2 10.7 hr  Laural Benes, Pharm.D., BCPS Clinical Pharmacist 09/09/2017,5:10 PM

## 2017-09-10 ENCOUNTER — Encounter: Payer: Self-pay | Admitting: Anesthesiology

## 2017-09-10 LAB — CBC
HCT: 37.3 % — ABNORMAL LOW (ref 40.0–52.0)
Hemoglobin: 12.8 g/dL — ABNORMAL LOW (ref 13.0–18.0)
MCH: 30.2 pg (ref 26.0–34.0)
MCHC: 34.4 g/dL (ref 32.0–36.0)
MCV: 87.8 fL (ref 80.0–100.0)
PLATELETS: 164 10*3/uL (ref 150–440)
RBC: 4.25 MIL/uL — ABNORMAL LOW (ref 4.40–5.90)
RDW: 13 % (ref 11.5–14.5)
WBC: 12.4 10*3/uL — AB (ref 3.8–10.6)

## 2017-09-10 LAB — BASIC METABOLIC PANEL
Anion gap: 6 (ref 5–15)
BUN: 16 mg/dL (ref 6–20)
CO2: 26 mmol/L (ref 22–32)
CREATININE: 1.22 mg/dL (ref 0.61–1.24)
Calcium: 8.7 mg/dL — ABNORMAL LOW (ref 8.9–10.3)
Chloride: 100 mmol/L — ABNORMAL LOW (ref 101–111)
GFR calc Af Amer: >60 mL/min (ref >60)
GFR, EST NON AFRICAN AMERICAN: 58 mL/min — AB (ref >60)
GLUCOSE: 177 mg/dL — AB (ref 65–99)
Potassium: 3.5 mmol/L (ref 3.5–5.1)
SODIUM: 132 mmol/L — AB (ref 135–145)

## 2017-09-10 LAB — GLUCOSE, CAPILLARY
GLUCOSE-CAPILLARY: 176 mg/dL — AB (ref 65–99)
GLUCOSE-CAPILLARY: 217 mg/dL — AB (ref 65–99)
Glucose-Capillary: 166 mg/dL — ABNORMAL HIGH (ref 65–99)
Glucose-Capillary: 151 mg/dL — ABNORMAL HIGH (ref 65–99)

## 2017-09-10 MED ORDER — INSULIN ASPART 100 UNIT/ML ~~LOC~~ SOLN
0.0000 [IU] | Freq: Three times a day (TID) | SUBCUTANEOUS | Status: DC
  Administered 2017-09-10 – 2017-09-11 (×3): 4 [IU] via SUBCUTANEOUS
  Administered 2017-09-12 (×2): 3 [IU] via SUBCUTANEOUS
  Filled 2017-09-10 (×5): qty 1

## 2017-09-10 MED ORDER — ALUM & MAG HYDROXIDE-SIMETH 200-200-20 MG/5ML PO SUSP
30.0000 mL | Freq: Four times a day (QID) | ORAL | Status: DC | PRN
  Administered 2017-09-10 (×2): 30 mL via ORAL
  Filled 2017-09-10 (×2): qty 30

## 2017-09-10 MED ORDER — POVIDONE-IODINE 7.5 % EX SOLN
Freq: Once | CUTANEOUS | Status: DC
  Filled 2017-09-10: qty 118

## 2017-09-10 MED ORDER — LIVING WELL WITH DIABETES BOOK
Freq: Once | Status: AC
  Administered 2017-09-10: 18:00:00
  Filled 2017-09-10: qty 1

## 2017-09-10 MED ORDER — INSULIN ASPART 100 UNIT/ML ~~LOC~~ SOLN
0.0000 [IU] | Freq: Every day | SUBCUTANEOUS | Status: DC
  Administered 2017-09-10: 2 [IU] via SUBCUTANEOUS
  Filled 2017-09-10: qty 1

## 2017-09-10 NOTE — Progress Notes (Signed)
Inpatient Diabetes Program Recommendations  AACE/ADA: New Consensus Statement on Inpatient Glycemic Control (2015)  Target Ranges:  Prepandial:   less than 140 mg/dL      Peak postprandial:   less than 180 mg/dL (1-2 hours)      Critically ill patients:  140 - 180 mg/dL     Inpatient Diabetes Program Recommendations: Met with patient and his with wife regarding home medications for diabetes- takes Metformin 820m tid- does not miss.  [facility] lowered A1C from 9.8% to 7.0%- October 2018.  Has lost 40 lbs on [employer].  Does not look at his feet daily- encouraged to get a hand mirror or wall mirror at floor level to look at feet daily.   JGentry Fitz RN, BA, MHA, CDE Diabetes Coordinator Inpatient Diabetes Program  [phone](Team Pager) [phone]([place] 09/10/2017 4:41 PM

## 2017-09-10 NOTE — Progress Notes (Signed)
Inpatient Diabetes Program Recommendations  AACE/ADA: New Consensus Statement on Inpatient Glycemic Control (2015)  Target Ranges:  Prepandial:   less than 140 mg/dL      Peak postprandial:   less than 180 mg/dL (1-2 hours)      Critically ill patients:  140 - 180 mg/dL   Lab Results  Component Value Date   GLUCAP 166 (H) 09/10/2017    Review of Glycemic Control  Results for [name], Larry Cobb (MRN [id number]) as of 09/10/2017 09:31  Ref. Range 09/09/2017 16:54 09/09/2017 21:38 09/10/2017 07:41  Glucose-Capillary Latest Ref Range: 65 - 99 mg/dL 185 (H) 188 (H) 166 (H)    Diabetes history: Type 2 Outpatient Diabetes medications: none noted Current orders for Inpatient glycemic control: Novolog 0-15 units tid, Novolog 0-5 units qhs  Inpatient Diabetes Program Recommendations:    Per ADA recommendations "consider performing an A1C on all patients with diabetes or hyperglycemia admitted to the hospital if not performed in the prior 3 months".  Post prandial numbers remain elevated despite Novolog 0-15 unit.  Consider increasing correction scale to resistant scale 0-20 units tid.   Gentry Fitz, RN, BA, MHA, CDE Diabetes Coordinator Inpatient Diabetes Program  [phone] (Team Pager) [phone] ([place]) 09/10/2017 9:40 AM

## 2017-09-10 NOTE — Anesthesia Preprocedure Evaluation (Addendum)
Anesthesia Evaluation  Patient identified by MRN, date of birth, ID band Patient awake    Reviewed: Allergy & Precautions, H&P , NPO status , Patient's Chart, lab work & pertinent test results, reviewed documented beta blocker date and time   History of Anesthesia Complications Negative for: history of anesthetic complications  Airway Mallampati: I  TM Distance: >3 FB Neck ROM: full    Dental  (+) Dental Advidsory Given   Pulmonary neg pulmonary ROS, former smoker,           Cardiovascular Exercise Tolerance: Good hypertension, Pt. on medications (-) angina(-) CAD, (-) Past MI, (-) Cardiac Stents and (-) CABG (-) dysrhythmias (-) Valvular Problems/Murmurs     Neuro/Psych neg Seizures PSYCHIATRIC DISORDERS Anxiety  Neuromuscular disease    GI/Hepatic Neg liver ROS, GERD  ,  Endo/Other  diabetes, Poorly ControlledMorbid obesity  Renal/GU negative Renal ROS  negative genitourinary   Musculoskeletal   Abdominal   Peds negative pediatric ROS (+)  Hematology negative hematology ROS (+)   Anesthesia Other Findings Past Medical History: No date: Diabetes mellitus without complication ([facility]) No date: Hypertension No date: Neuropathy No date: PTSD (post-traumatic stress disorder)  Reproductive/Obstetrics negative OB ROS                            Anesthesia Physical Anesthesia Plan  ASA: III  Anesthesia Plan: General   Post-op Pain Management:    Induction: Intravenous  PONV Risk Score and Plan: 2 and Ondansetron and Propofol infusion  Airway Management Planned: Simple Face Mask  Additional Equipment:   Intra-op Plan:   Post-operative Plan:   Informed Consent: I have reviewed the patients History and Physical, chart, labs and discussed the procedure including the risks, benefits and alternatives for the proposed anesthesia with the patient or authorized representative who has  indicated his/her understanding and acceptance.   Dental Advisory Given  Plan Discussed with: Anesthesiologist, CRNA and Surgeon  Anesthesia Plan Comments:         Anesthesia Quick Evaluation

## 2017-09-10 NOTE — Consult Note (Signed)
[facility] Nurse wound consult note Reason for Consult: Neuropathic ulcer to right second metatarsal.  Podiatry has consulted and recommended surgical intervention.  No wound care needs at this time.  Dry dressing to toe.  Will not follow at this time.  Please re-consult if needed.  Domenic Moras RN BSN [place] Pager [phone]

## 2017-09-10 NOTE — Progress Notes (Signed)
[facility] at [place] NAME: Larry Cobb    MR#:  [id number]  DATE OF BIRTH:  1946-05-10  SUBJECTIVE:  CHIEF COMPLAINT:  No chief complaint on file.   Came with toe infection, no pain because of loss of sensations. Found to have osteomyelitis with gas-forming bacteria hours, no complaints overall.  REVIEW OF SYSTEMS:  CONSTITUTIONAL: No fever, fatigue or weakness.  EYES: No blurred or double vision.  EARS, NOSE, AND THROAT: No tinnitus or ear pain.  RESPIRATORY: No cough, shortness of breath, wheezing or hemoptysis.  CARDIOVASCULAR: No chest pain, orthopnea, edema.  GASTROINTESTINAL: No nausea, vomiting, diarrhea or abdominal pain.  GENITOURINARY: No dysuria, hematuria.  ENDOCRINE: No polyuria, nocturia,  HEMATOLOGY: No anemia, easy bruising or bleeding SKIN: No rash or lesion. MUSCULOSKELETAL: No joint pain or arthritis.   NEUROLOGIC: No tingling, numbness, weakness.  PSYCHIATRY: No anxiety or depression.   ROS  DRUG ALLERGIES:   Allergies  Allergen Reactions  . Indocin [Indomethacin] Other (See Comments)    "I bleed through my skin"    VITALS:  Blood pressure 126/64, pulse 82, temperature 98.4 F (36.9 C), temperature source Oral, resp. rate 20, height 6\' 2"  (1.88 m), weight (!) 137.1 kg (302 lb 3.2 oz), SpO2 92 %.  PHYSICAL EXAMINATION:  GENERAL:  71 y.o.-year-old patient lying in the bed with no acute distress.  EYES: Pupils equal, round, reactive to light and accommodation. No scleral icterus. Extraocular muscles intact.  HEENT: Head atraumatic, normocephalic. Oropharynx and nasopharynx clear.  NECK:  Supple, no jugular venous distention. No thyroid enlargement, no tenderness.  LUNGS: Normal breath sounds bilaterally, no wheezing, rales,rhonchi or crepitation. No use of accessory muscles of respiration.  CARDIOVASCULAR: S1, S2 normal. No murmurs, rubs, or gallops.  ABDOMEN: Soft, nontender, nondistended. Bowel sounds  present. No organomegaly or mass.  EXTREMITIES: No pedal edema, cyanosis, or clubbing. Right second toe is area of necrosis and some drain drainage. Dressing present. NEUROLOGIC: Cranial nerves II through XII are intact. Muscle strength 5/5 in all extremities. Sensation intact. Gait not checked.  PSYCHIATRIC: The patient is alert and oriented x 3.  SKIN: No obvious rash, lesion, or ulcer.   Physical Exam LABORATORY PANEL:   CBC Recent Labs  Lab 09/10/17 0437  WBC 12.4*  HGB 12.8*  HCT 37.3*  PLT 164   ------------------------------------------------------------------------------------------------------------------  Chemistries  Recent Labs  Lab 09/10/17 0437  NA 132*  K 3.5  CL 100*  CO2 26  GLUCOSE 177*  BUN 16  CREATININE 1.22  CALCIUM 8.7*   ------------------------------------------------------------------------------------------------------------------  Cardiac Enzymes No results for input(s): TROPONINI in the last 168 hours. ------------------------------------------------------------------------------------------------------------------  RADIOLOGY:  Dg Foot 2 Views Right  Result Date: 09/09/2017 CLINICAL DATA:  RIGHT foot osteomyelitis. EXAM: RIGHT FOOT - 2 VIEW COMPARISON:  None. FINDINGS: Bony re- absorption tuft of second distal phalanx associated with soft tissue swelling and subcutaneous gas. Moderate vascular calcifications. No fracture deformity or dislocation. Moderate plantar calcaneal spur. Moderate midfoot osteoarthrosis. Rounded density projecting over distal tibia/ fibula, probable old fracture. IMPRESSION: Second distal phalanx osteomyelitis with soft tissue swelling and gas. These results will be called to the ordering clinician or representative by the Radiologist Assistant, and communication documented in the PACS or zVision Dashboard. Electronically Signed   By: Elon Alas M.D.   On: 09/09/2017 19:30    ASSESSMENT AND PLAN:   Active  Problems:   Foot infection  71 year old male with history of diabetes and diabetic neuropathy who was  sent from his PCP office with concerns of diabetic foot infection.  1. Diabetic foot infection with osteomyelitis Podiatry consultation appreciated Plan for amputation tomorrow. Vancomycin and Zosyn  Follow-up on labs 2. Diabetes: Continue sliding scale with ADA diet Diabetes nurse consultation requested 3. Essential hypertension: Continue Norvasc  4. PTSD: Continue trazodone and Lamictal    All the records are reviewed and case discussed with Care Management/Social Workerr. Management plans discussed with the patient, family and they are in agreement.  CODE STATUS: Full.  TOTAL TIME TAKING CARE OF THIS PATIENT: 35 minutes.    POSSIBLE D/C IN 1-2 DAYS, DEPENDING ON CLINICAL CONDITION.   Larry Cobb M.D on 09/10/2017   Between 7am to 6pm - Pager - [phone]  After 6pm go to [email or web address] - password [email or web address] [facility]  Office  [phone]  CC: Primary care physician; Baxter Hire, MD  Note: This dictation was prepared with Dragon dictation along with smaller phrase technology. Any transcriptional errors that result from this process are unintentional.

## 2017-09-10 NOTE — Consult Note (Signed)
ORTHOPAEDIC CONSULTATION  REQUESTING PHYSICIAN: Vaughan Basta, *  Chief Complaint: Infection right second toe  HPI: Larry Cobb is a 71 y.o. male who complains of infected right second toe.  States over the last few days he noticed redness and drainage to his right second toe.  He is neuropathic.  He did have some discomfort to the area.  Seen by his primary care physician and recommended admission to the hospital.  States his blood sugars have been fairly well controlled and states his hemoglobin A1c is around 7 in the outpatient clinic.  He denies any fever or chills.  Past Medical History:  Diagnosis Date  . Diabetes mellitus without complication ([place])   . Hypertension   . Neuropathy   . PTSD (post-traumatic stress disorder)    Past Surgical History:  Procedure Laterality Date  . ABLATION    . APPENDECTOMY    . CATARACT EXTRACTION    . COLON SURGERY    . CORNEAL TRANSPLANT    . HERNIA REPAIR    . JOINT REPLACEMENT     Social History   Socioeconomic History  . Marital status: Married    Spouse name: None  . Number of children: None  . Years of education: None  . Highest education level: None  Social Needs  . Financial resource strain: None  . Food insecurity - worry: None  . Food insecurity - inability: None  . Transportation needs - medical: None  . Transportation needs - non-medical: None  Occupational History  . None  Tobacco Use  . Smoking status: Former Research scientist (life sciences)  . Smokeless tobacco: Never Used  Substance and Sexual Activity  . Alcohol use: No  . Drug use: No  . Sexual activity: None  Other Topics Concern  . None  Social History Narrative  . None   History reviewed. No pertinent family history. Allergies  Allergen Reactions  . Indocin [Indomethacin] Other (See Comments)    "I bleed through my skin"   Prior to Admission medications   Medication Sig Start Date End Date Taking? Authorizing Provider  clindamycin (CLEOCIN) 300 MG capsule  Take 1 capsule (300 mg total) by mouth 3 (three) times daily. 03/24/16   Harvest Dark, MD   Dg Foot 2 Views Right  Result Date: 09/09/2017 CLINICAL DATA:  RIGHT foot osteomyelitis. EXAM: RIGHT FOOT - 2 VIEW COMPARISON:  None. FINDINGS: Bony re- absorption tuft of second distal phalanx associated with soft tissue swelling and subcutaneous gas. Moderate vascular calcifications. No fracture deformity or dislocation. Moderate plantar calcaneal spur. Moderate midfoot osteoarthrosis. Rounded density projecting over distal tibia/ fibula, probable old fracture. IMPRESSION: Second distal phalanx osteomyelitis with soft tissue swelling and gas. These results will be called to the ordering clinician or representative by the Radiologist Assistant, and communication documented in the PACS or zVision Dashboard. Electronically Signed   By: Elon Alas M.D.   On: 09/09/2017 19:30    Positive ROS: All other systems have been reviewed and were otherwise negative with the exception of those mentioned in the HPI and as above.  12 point ROS was performed.  Physical Exam: General: Alert and oriented.  No apparent distress.  Vascular:  Left foot:Dorsalis Pedis:  present Posterior Tibial:  present  Right foot: Dorsalis Pedis:  present Posterior Tibial:  present  Neuro:absent protective sensation  Derm: Left foot without ulceration or pre-ulcerative lesion.  Right second toe has areas of superficial necrosis and drainage from the distal aspect of the second toe.  Noted erythema  proximal to the MTPJ with superficial lymphangitic streak on the dorsal foot.  No other open ulcerations.  Ortho/MS: Left foot without issue.  Right foot with diffuse edema diffusely throughout.  No crepitance through the foot.  No pain.  Patient is neuropathic.  Assessment: Gas producing infection with erosion distal phalanx second toe consistent with osteomyelitis  Diabetes with neuropathy  Plan: Patient has a gas  producing infection along with osteomyelitis on the distal aspect of the second toe on x-ray.  At this point he would need to undergo distal second toe amputation to about the level of the MTPJ or just distal to this.  Discussed this with the patient and consent has been given.  We will plan for surgery tomorrow.  Continue with current antibiotics.  Deep wound culture will be performed intraoperatively.  Okay for ambulation as tolerated for now.    Elesa Hacker, DPM Cell [phone]   09/10/2017 8:18 AM

## 2017-09-11 ENCOUNTER — Encounter
Admission: AD | Disposition: A | Discharge status: Home / Self Care | Payer: Self-pay | Source: Ambulatory Visit | Attending: Internal Medicine

## 2017-09-11 ENCOUNTER — Encounter: Payer: Self-pay | Admitting: *Deleted

## 2017-09-11 ENCOUNTER — Inpatient Hospital Stay: Payer: Medicare Other | Admitting: Certified Registered Nurse Anesthetist

## 2017-09-11 HISTORY — PX: AMPUTATION TOE: SHX6595

## 2017-09-11 LAB — CBC
HEMATOCRIT: 36.7 % — AB (ref 40.0–52.0)
HEMOGLOBIN: 12.7 g/dL — AB (ref 13.0–18.0)
MCH: 30.6 pg (ref 26.0–34.0)
MCHC: 34.7 g/dL (ref 32.0–36.0)
MCV: 88.2 fL (ref 80.0–100.0)
Platelets: 181 10*3/uL (ref 150–440)
RBC: 4.16 MIL/uL — ABNORMAL LOW (ref 4.40–5.90)
RDW: 13.3 % (ref 11.5–14.5)
WBC: 9.7 10*3/uL (ref 3.8–10.6)

## 2017-09-11 LAB — GLUCOSE, CAPILLARY
GLUCOSE-CAPILLARY: 163 mg/dL — AB (ref 65–99)
Glucose-Capillary: 129 mg/dL — ABNORMAL HIGH (ref 65–99)
Glucose-Capillary: 179 mg/dL — ABNORMAL HIGH (ref 65–99)
Glucose-Capillary: 132 mg/dL — ABNORMAL HIGH (ref 65–99)

## 2017-09-11 LAB — BASIC METABOLIC PANEL
ANION GAP: 7 (ref 5–15)
BUN: 16 mg/dL (ref 6–20)
CHLORIDE: 102 mmol/L (ref 101–111)
CO2: 24 mmol/L (ref 22–32)
CREATININE: 1.06 mg/dL (ref 0.61–1.24)
Calcium: 8.5 mg/dL — ABNORMAL LOW (ref 8.9–10.3)
GFR calc non Af Amer: >60 mL/min (ref >60)
Glucose, Bld: 166 mg/dL — ABNORMAL HIGH (ref 65–99)
Potassium: 3.3 mmol/L — ABNORMAL LOW (ref 3.5–5.1)
Sodium: 133 mmol/L — ABNORMAL LOW (ref 135–145)

## 2017-09-11 LAB — VANCOMYCIN, TROUGH: Vancomycin Tr: 12 ug/mL — ABNORMAL LOW (ref 15–20)

## 2017-09-11 LAB — HEMOGLOBIN A1C
Hgb A1c MFr Bld: 6.6 % — ABNORMAL HIGH (ref 4.8–5.6)
MEAN PLASMA GLUCOSE: 142.72 mg/dL

## 2017-09-11 LAB — CREATININE, SERUM
CREATININE: 1.09 mg/dL (ref 0.61–1.24)
GFR calc Af Amer: >60 mL/min (ref >60)
GFR calc non Af Amer: >60 mL/min (ref >60)

## 2017-09-11 LAB — SURGICAL PCR SCREEN
MRSA, PCR: NEGATIVE
Staphylococcus aureus: POSITIVE — AB

## 2017-09-11 SURGERY — AMPUTATION, TOE
Anesthesia: General | Site: Toe | Laterality: Right | Wound class: Dirty or Infected

## 2017-09-11 MED ORDER — MUPIROCIN 2 % EX OINT
1.0000 "application " | TOPICAL_OINTMENT | Freq: Two times a day (BID) | CUTANEOUS | Status: DC
  Administered 2017-09-11 – 2017-09-12 (×3): 1 via NASAL
  Filled 2017-09-11 (×2): qty 22

## 2017-09-11 MED ORDER — VANCOMYCIN HCL 10 G IV SOLR
1500.0000 mg | Freq: Two times a day (BID) | INTRAVENOUS | Status: DC
  Administered 2017-09-12: 1500 mg via INTRAVENOUS
  Filled 2017-09-11 (×3): qty 1500

## 2017-09-11 MED ORDER — PROPOFOL 500 MG/50ML IV EMUL
INTRAVENOUS | Status: DC | PRN
  Administered 2017-09-11: 50 ug/kg/min via INTRAVENOUS

## 2017-09-11 MED ORDER — LIDOCAINE HCL (PF) 1 % IJ SOLN
INTRAMUSCULAR | Status: DC | PRN
  Administered 2017-09-11: 5 mL

## 2017-09-11 MED ORDER — BUPIVACAINE HCL 0.5 % IJ SOLN
INTRAMUSCULAR | Status: DC | PRN
  Administered 2017-09-11: 5 mL

## 2017-09-11 MED ORDER — BUPIVACAINE HCL (PF) 0.5 % IJ SOLN: INTRAMUSCULAR | Status: DC | PRN

## 2017-09-11 MED ORDER — KETAMINE HCL 50 MG/ML IJ SOLN
INTRAMUSCULAR | Status: DC | PRN
  Administered 2017-09-11: 10 mg via INTRAMUSCULAR

## 2017-09-11 MED ORDER — PROPOFOL 500 MG/50ML IV EMUL
INTRAVENOUS | Status: AC
  Filled 2017-09-11: qty 50

## 2017-09-11 MED ORDER — MIDAZOLAM HCL 2 MG/2ML IJ SOLN
INTRAMUSCULAR | Status: AC
  Filled 2017-09-11: qty 2

## 2017-09-11 MED ORDER — ONDANSETRON HCL 4 MG/2ML IJ SOLN: 4.0000 mg | Freq: Once | INTRAMUSCULAR | Status: DC | PRN

## 2017-09-11 MED ORDER — BUPIVACAINE HCL (PF) 0.5 % IJ SOLN
INTRAMUSCULAR | Status: AC
  Filled 2017-09-11: qty 30

## 2017-09-11 MED ORDER — BUPIVACAINE LIPOSOME 1.3 % IJ SUSP: INTRAMUSCULAR | Status: DC | PRN

## 2017-09-11 MED ORDER — LACTATED RINGERS IV SOLN
INTRAVENOUS | Status: DC | PRN
Start: 2017-09-11 — End: 2017-09-11
  Administered 2017-09-11: 12:00:00 via INTRAVENOUS

## 2017-09-11 MED ORDER — OXYCODONE-ACETAMINOPHEN 5-325 MG PO TABS
1.0000 | ORAL_TABLET | Freq: Four times a day (QID) | ORAL | Status: DC | PRN
  Administered 2017-09-11: 2 via ORAL
  Filled 2017-09-11: qty 2

## 2017-09-11 MED ORDER — METFORMIN HCL 850 MG PO TABS
850.0000 mg | ORAL_TABLET | Freq: Two times a day (BID) | ORAL | Status: DC
  Administered 2017-09-11 – 2017-09-12 (×2): 850 mg via ORAL
  Filled 2017-09-11 (×3): qty 1

## 2017-09-11 MED ORDER — GLYCOPYRROLATE 0.2 MG/ML IJ SOLN
INTRAMUSCULAR | Status: DC | PRN
  Administered 2017-09-11: 0.2 mg via INTRAVENOUS

## 2017-09-11 MED ORDER — DEXMEDETOMIDINE HCL 200 MCG/2ML IV SOLN
INTRAVENOUS | Status: DC | PRN
  Administered 2017-09-11: 8 ug via INTRAVENOUS

## 2017-09-11 MED ORDER — KETAMINE HCL 50 MG/ML IJ SOLN
INTRAMUSCULAR | Status: AC
  Filled 2017-09-11: qty 10

## 2017-09-11 MED ORDER — FENTANYL CITRATE (PF) 100 MCG/2ML IJ SOLN: 25.0000 ug | INTRAMUSCULAR | Status: DC | PRN

## 2017-09-11 MED ORDER — LIDOCAINE HCL (PF) 1 % IJ SOLN: INTRAMUSCULAR | Status: DC | PRN

## 2017-09-11 MED ORDER — ONDANSETRON HCL 4 MG/2ML IJ SOLN
INTRAMUSCULAR | Status: AC
  Filled 2017-09-11: qty 2

## 2017-09-11 MED ORDER — FENTANYL CITRATE (PF) 100 MCG/2ML IJ SOLN
INTRAMUSCULAR | Status: AC
  Filled 2017-09-11: qty 2

## 2017-09-11 MED ORDER — MIDAZOLAM HCL 5 MG/5ML IJ SOLN
INTRAMUSCULAR | Status: DC | PRN
  Administered 2017-09-11: 2 mg via INTRAVENOUS

## 2017-09-11 MED ORDER — CHLORHEXIDINE GLUCONATE CLOTH 2 % EX PADS
6.0000 | MEDICATED_PAD | Freq: Every day | CUTANEOUS | Status: DC
  Administered 2017-09-11 – 2017-09-12 (×2): 6 via TOPICAL

## 2017-09-11 SURGICAL SUPPLY — 40 items
BANDAGE ELASTIC 4 LF NS (GAUZE/BANDAGES/DRESSINGS) ×2 IMPLANT
BANDAGE STRETCH 3X4.1 STRL (GAUZE/BANDAGES/DRESSINGS) ×4 IMPLANT
BLADE OSC/SAGITTAL MD 5.5X18 (BLADE) ×4 IMPLANT
BLADE SURG MINI STRL (BLADE) ×2 IMPLANT
BNDG ESMARK 4X12 TAN STRL LF (GAUZE/BANDAGES/DRESSINGS) ×2 IMPLANT
BNDG GAUZE 4.5X4.1 6PLY STRL (MISCELLANEOUS) ×2 IMPLANT
CANISTER SUCT 1200ML W/VALVE (MISCELLANEOUS) ×2 IMPLANT
DRAPE FLUOR MINI C-ARM 54X84 (DRAPES) ×2 IMPLANT
DRAPE XRAY CASSETTE 23X24 (DRAPES) ×2 IMPLANT
DURAPREP 26ML APPLICATOR (WOUND CARE) ×2 IMPLANT
ELECT REM PT RETURN 9FT ADLT (ELECTROSURGICAL) ×2
ELECTRODE REM PT RTRN 9FT ADLT (ELECTROSURGICAL) ×1 IMPLANT
GAUZE PACKING IODOFORM 1/2 (PACKING) ×2 IMPLANT
GAUZE PETRO XEROFOAM 1X8 (MISCELLANEOUS) ×2 IMPLANT
GAUZE SPONGE 4X4 12PLY STRL (GAUZE/BANDAGES/DRESSINGS) ×2 IMPLANT
GAUZE STRETCH 2X75IN STRL (MISCELLANEOUS) ×2 IMPLANT
GLOVE BIO SURGEON STRL SZ7.5 (GLOVE) ×2 IMPLANT
GLOVE INDICATOR 8.0 STRL GRN (GLOVE) ×2 IMPLANT
GOWN STRL REUS W/ TWL LRG LVL3 (GOWN DISPOSABLE) ×2 IMPLANT
GOWN STRL REUS W/TWL LRG LVL3 (GOWN DISPOSABLE) ×2
KIT RM TURNOVER STRD PROC AR (KITS) ×2 IMPLANT
LABEL OR SOLS (LABEL) ×2 IMPLANT
NEEDLE FILTER BLUNT 18X 1/2SAF (NEEDLE) ×1
NEEDLE FILTER BLUNT 18X1 1/2 (NEEDLE) ×1 IMPLANT
NEEDLE HYPO 25X1 1.5 SAFETY (NEEDLE) ×2 IMPLANT
NS IRRIG 500ML POUR BTL (IV SOLUTION) ×2 IMPLANT
PACK EXTREMITY ARMC (MISCELLANEOUS) ×2 IMPLANT
PAD ABD DERMACEA PRESS 5X9 (GAUZE/BANDAGES/DRESSINGS) ×4 IMPLANT
PULSAVAC PLUS IRRIG FAN TIP (DISPOSABLE) ×2
SHIELD FULL FACE ANTIFOG 7M (MISCELLANEOUS) ×2 IMPLANT
SOL .9 NS 3000ML IRR  AL (IV SOLUTION) ×1
SOL .9 NS 3000ML IRR UROMATIC (IV SOLUTION) ×1 IMPLANT
STOCKINETTE M/LG 89821 (MISCELLANEOUS) ×2 IMPLANT
STRAP SAFETY BODY (MISCELLANEOUS) ×2 IMPLANT
SUT ETHILON 3-0 FS-10 30 BLK (SUTURE) ×2
SUT ETHILON 5-0 FS-2 18 BLK (SUTURE) ×2 IMPLANT
SUT VIC AB 4-0 FS2 27 (SUTURE) ×2 IMPLANT
SUTURE EHLN 3-0 FS-10 30 BLK (SUTURE) ×1 IMPLANT
SYR 10ML LL (SYRINGE) ×6 IMPLANT
TIP FAN IRRIG PULSAVAC PLUS (DISPOSABLE) ×1 IMPLANT

## 2017-09-11 NOTE — Progress Notes (Signed)
[facility] at [place] NAME: Larry Cobb    MR#:  [id number]  DATE OF BIRTH:  08-16-1946  SUBJECTIVE:  CHIEF COMPLAINT:  No chief complaint on file.   Came with toe infection, no pain because of loss of sensations. Found to have osteomyelitis with gas-forming bacteria hours, no complaints overall.   Have s/p amputation now on 09/11/17.  REVIEW OF SYSTEMS:  CONSTITUTIONAL: No fever, fatigue or weakness.  EYES: No blurred or double vision.  EARS, NOSE, AND THROAT: No tinnitus or ear pain.  RESPIRATORY: No cough, shortness of breath, wheezing or hemoptysis.  CARDIOVASCULAR: No chest pain, orthopnea, edema.  GASTROINTESTINAL: No nausea, vomiting, diarrhea or abdominal pain.  GENITOURINARY: No dysuria, hematuria.  ENDOCRINE: No polyuria, nocturia,  HEMATOLOGY: No anemia, easy bruising or bleeding SKIN: No rash or lesion. MUSCULOSKELETAL: No joint pain or arthritis.   NEUROLOGIC: No tingling, numbness, weakness.  PSYCHIATRY: No anxiety or depression.   ROS  DRUG ALLERGIES:   Allergies  Allergen Reactions  . Indocin [Indomethacin] Other (See Comments)    "I bleed through my skin"    VITALS:  Blood pressure 109/63, pulse 77, temperature 98.1 F (36.7 C), temperature source Oral, resp. rate 20, height 6\' 2"  (1.88 m), weight (!) 137 kg (302 lb), SpO2 91 %.  PHYSICAL EXAMINATION:  GENERAL:  71 y.o.-year-old patient lying in the bed with no acute distress.  EYES: Pupils equal, round, reactive to light and accommodation. No scleral icterus. Extraocular muscles intact.  HEENT: Head atraumatic, normocephalic. Oropharynx and nasopharynx clear.  NECK:  Supple, no jugular venous distention. No thyroid enlargement, no tenderness.  LUNGS: Normal breath sounds bilaterally, no wheezing, rales,rhonchi or crepitation. No use of accessory muscles of respiration.  CARDIOVASCULAR: S1, S2 normal. No murmurs, rubs, or gallops.  ABDOMEN: Soft, nontender,  nondistended. Bowel sounds present. No organomegaly or mass.  EXTREMITIES: No pedal edema, cyanosis, or clubbing. Right foot Dressing present. NEUROLOGIC: Cranial nerves II through XII are intact. Muscle strength 5/5 in all extremities. Sensation intact. Gait not checked.  PSYCHIATRIC: The patient is alert and oriented x 3.  SKIN: No obvious rash, lesion, or ulcer.   Physical Exam LABORATORY PANEL:   CBC Recent Labs  Lab 09/11/17 0423  WBC 9.7  HGB 12.7*  HCT 36.7*  PLT 181   ------------------------------------------------------------------------------------------------------------------  Chemistries  Recent Labs  Lab 09/11/17 0423 09/11/17 1309  NA 133*  --   K 3.3*  --   CL 102  --   CO2 24  --   GLUCOSE 166*  --   BUN 16  --   CREATININE 1.06 1.09  CALCIUM 8.5*  --    ------------------------------------------------------------------------------------------------------------------  Cardiac Enzymes No results for input(s): TROPONINI in the last 168 hours. ------------------------------------------------------------------------------------------------------------------  RADIOLOGY:  Dg Foot 2 Views Right  Result Date: 09/09/2017 CLINICAL DATA:  RIGHT foot osteomyelitis. EXAM: RIGHT FOOT - 2 VIEW COMPARISON:  None. FINDINGS: Bony re- absorption tuft of second distal phalanx associated with soft tissue swelling and subcutaneous gas. Moderate vascular calcifications. No fracture deformity or dislocation. Moderate plantar calcaneal spur. Moderate midfoot osteoarthrosis. Rounded density projecting over distal tibia/ fibula, probable old fracture. IMPRESSION: Second distal phalanx osteomyelitis with soft tissue swelling and gas. These results will be called to the ordering clinician or representative by the Radiologist Assistant, and communication documented in the PACS or zVision Dashboard. Electronically Signed   By: Elon Alas M.D.   On: 09/09/2017 19:30     ASSESSMENT AND  PLAN:   Active Problems:   Foot infection  71 year old male with history of diabetes and diabetic neuropathy who was sent from his PCP office with concerns of diabetic foot infection.  1. Diabetic foot infection with osteomyelitis Podiatry consultation appreciated S/p amputation of second right toe Vancomycin and Zosyn   Given a boot to prevent pressure- likely d/c tomorrow. Follow-up on labs 2. Diabetes: Continue sliding scale with ADA diet Diabetes nurse consultation requested 3. Essential hypertension: Continue Norvasc  4. PTSD: Continue trazodone and Lamictal   All the records are reviewed and case discussed with Care Management/Social Workerr. Management plans discussed with the patient, family and they are in agreement.  CODE STATUS: Full.  TOTAL TIME TAKING CARE OF THIS PATIENT: 35 minutes.    POSSIBLE D/C IN 1-2 DAYS, DEPENDING ON CLINICAL CONDITION.   Vaughan Basta M.D on 09/11/2017   Between 7am to 6pm - Pager - [phone]  After 6pm go to [email or web address] - password [email or web address] [facility]  Office  [phone]  CC: Primary care physician; Baxter Hire, MD  Note: This dictation was prepared with Dragon dictation along with smaller phrase technology. Any transcriptional errors that result from this process are unintentional.

## 2017-09-11 NOTE — Op Note (Signed)
Operative note   Surgeon:Tameisha Covell Lawyer: None    Preop diagnosis: Gangrene right second toe    Postop diagnosis: Same    Procedure: Amputation right second toe    EBL: Minimal    Anesthesia:local and IV sedation    Hemostasis: None    Specimen: Gangrenous second toe for pathology and deep wound culture    Complications: None    Operative indications:Larry Cobb is an 71 y.o. that presents today for surgical intervention.  The risks/benefits/alternatives/complications have been discussed and consent has been given.    Procedure:  Patient was brought into the OR and placed on the operating table in thesupine position. After anesthesia was obtained theright lower extremity was prepped and draped in usual sterile fashion.  Attention was directed to the right second toe where noted necrotic tissue was found to the level of approximately the PIPJ.  [place] incision was performed dorsal and plantarly.  Full-thickness incision was performed.  Osteotomy was created through and through at the proximal phalanx midshaft.  Toe was then removed from the surgical field in toto.  Bleeders were Bovie cauterized.  Wound was flushed with copious amounts of irrigation.  Closure was performed with a 3-0 nylon.  Small area left open on the lateral aspect of the incision site for drainage.  Bulky sterile dressing was then applied.    Patient tolerated the procedure and anesthesia well.  Was transported from the OR to the PACU with all vital signs stable and vascular status intact. To be discharged per routine protocol.  Will follow up in approximately 1 week in the outpatient clinic.

## 2017-09-11 NOTE — Anesthesia Procedure Notes (Deleted)
Anesthesia Regional Block: Interscalene brachial plexus block   Pre-Anesthetic Checklist: ,, timeout performed, Correct Patient, Correct Site, Correct Laterality, Correct Procedure, Correct Position, site marked, Risks and benefits discussed,  Surgical consent,  Pre-op evaluation,  At surgeon's request and post-op pain management  Laterality: Right and Upper  Prep: alcohol swabs       Needles:  Injection technique: Single-shot  Needle Type: Stimiplex     Needle Length: 5cm  Needle Gauge: 22     Additional Needles:   Procedures:,,,, ultrasound used (permanent image in chart),,,,  Narrative:  Start time: 09/11/2017 10:58 AM End time: 09/11/2017 11:03 AM Injection made incrementally with aspirations every 5 mL.  Performed by: Personally  Anesthesiologist: Martha Clan, MD  Additional Notes: Functioning IV was confirmed and monitors were applied.  A 47mm 22ga Stimuplex needle was used. Sterile prep and drape,hand hygiene and sterile gloves were used.  Negative aspiration and negative test dose prior to incremental administration of local anesthetic. The patient tolerated the procedure well.

## 2017-09-11 NOTE — Anesthesia Postprocedure Evaluation (Signed)
Anesthesia Post Note  Patient: KELSON QUEENAN  Procedure(s) Performed: 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 (Right Toe)  Patient location during evaluation: PACU Anesthesia Type: General Level of consciousness: awake and alert Pain management: pain level controlled Vital Signs Assessment: post-procedure vital signs reviewed and stable Respiratory status: spontaneous breathing, nonlabored ventilation, respiratory function stable and patient connected to nasal cannula oxygen Cardiovascular status: blood pressure returned to baseline and stable Postop Assessment: no apparent nausea or vomiting Anesthetic complications: no     Last Vitals:  Vitals:   09/11/17 1438 09/11/17 1528  BP: 109/63 114/60  Pulse: 77 77  Resp: 20 14  Temp: 36.7 C 36.7 C  SpO2: 91% 97%    Last Pain:  Vitals:   09/11/17 1528  TempSrc: Oral  PainSc:                  Martha Clan

## 2017-09-11 NOTE — Care Management Important Message (Signed)
Important Message  Patient Details  Name: VINAY ERTL MRN: [id number] Date of Birth: 18-Jul-1946   Medicare Important Message Given:  Yes    Beverly Sessions, RN 09/11/2017, 4:08 PM

## 2017-09-11 NOTE — Progress Notes (Signed)
Patient has returned from surgery for toe amputation. Alert and oriented with 0 reports of pain. Right foot dressed with gauze unable to assess surgical site.

## 2017-09-11 NOTE — Progress Notes (Signed)
Inpatient Diabetes Program Recommendations  AACE/ADA: New Consensus Statement on Inpatient Glycemic Control (2015)  Target Ranges:  Prepandial:   less than 140 mg/dL      Peak postprandial:   less than 180 mg/dL (1-2 hours)      Critically ill patients:  140 - 180 mg/dL   Lab Results  Component Value Date   GLUCAP 163 (H) 09/11/2017    Review of Glycemic Control  Results for Larry Cobb, Larry Cobb (MRN [id number]) as of 09/11/2017 11:53  Ref. Range 09/10/2017 07:41 09/10/2017 11:45 09/10/2017 17:19 09/10/2017 21:12 09/11/2017 07:45  Glucose-Capillary Latest Ref Range: 65 - 99 mg/dL 166 (H) 176 (H) 151 (H) 217 (H) 163 (H)   Most recent A1C in October 7.0% at the [place]   Diabetes history: Type 2 Outpatient Diabetes medications: Metformin 850mg  tid Current orders for Inpatient glycemic control: Novolog 0-20 units tid, Novolog 0-5 units qhs  Inpatient Diabetes Program Recommendations:    Per ADA recommendations "consider performing an A1C on all patients with diabetes or hyperglycemia admitted to the hospital if not performed in the prior 3 months".   Gentry Fitz, RN, BA, MHA, CDE Diabetes Coordinator Inpatient Diabetes Program  [phone] (Team Pager) [phone] ([place]) 09/11/2017 11:56 AM

## 2017-09-11 NOTE — Anesthesia Post-op Follow-up Note (Signed)
Anesthesia QCDR form completed.        

## 2017-09-11 NOTE — Progress Notes (Signed)
ANTIBIOTIC CONSULT NOTE - INITIAL  Pharmacy Consult for vancomycin and Zosyn Indication: wound Cobb  Allergies  Allergen Reactions  . Indocin [Indomethacin] Other (See Comments)    "I bleed through my skin"    Patient Measurements: Height: 6\' 2"  (188 cm) Weight: (!) 302 lb (137 kg) IBW/kg (Calculated) : 82.2 Adjusted Body Weight: 102.9 kg  Vital Signs: Temp: 98.1 F (36.7 C) (12/28 1438) Temp Source: Oral (12/28 1438) BP: 109/63 (12/28 1438) Pulse Rate: 77 (12/28 1438) Intake/Output from previous day: 12/27 0701 - 12/28 0700 In: 1272 [P.O.:840; I.V.:432] Out: 675 [Urine:675] Intake/Output from this shift: Total I/O In: 2363.9 [P.O.:240; I.V.:2023.9; IV Piggyback:100] Out: 105 [Urine:100; Blood:5]  Labs: Recent Labs    09/09/17 1724 09/10/17 0437 09/11/17 0423 09/11/17 1309  WBC 15.3* 12.4* 9.7  --   HGB 13.7 12.8* 12.7*  --   PLT 181 164 181  --   CREATININE 1.36* 1.22 1.06 1.09   Estimated Creatinine Clearance: 91.5 mL/min (by C-G formula based on SCr of 1.09 mg/dL). Recent Labs    09/11/17 1309  [place] 12*     Microbiology: Recent Results (from the past 720 hour(s))  Surgical pcr screen     Status: Abnormal   Collection Time: 09/10/17 10:00 PM  Result Value Ref Range Status   MRSA, PCR NEGATIVE NEGATIVE Final   Staphylococcus aureus POSITIVE (A) NEGATIVE Final    Comment: (NOTE) The Xpert SA Assay (FDA approved for NASAL specimens in patients 31 years of age and older), is one component of a comprehensive surveillance program. It is not intended to diagnose Cobb nor to guide or monitor treatment. Performed at [facility], [address], [place], [place] [address]     Medical History: Past Medical History:  Diagnosis Date  . Diabetes mellitus without complication ([place])   . Hypertension   . Neuropathy   . PTSD (post-traumatic stress disorder)     Medications:  Infusions:  . sodium chloride 75 mL/hr at 09/11/17  0534  . piperacillin-tazobactam (ZOSYN)  IV 3.375 g (09/11/17 0811)  . [START ON 09/12/2017] vancomycin     Assessment: Larry Cobb. PMH PTSD, DM neuropathy, DM. Direct admit from PCP d/t right foot Cobb. Pharmacy consulted to dose vancomycin and Zosyn for wound Cobb  Goal of Therapy:  Vancomycin trough level 15-20 mcg/ml  Plan:  09/11/17 1309 vancomycin trough 12 mcg/mL - drawn at appropriate interval and before dose was administered - likely true trough. Recalculated Ke 0.074 hr-1, T1/2 9.4 hr. Increase to vancomycin 1.5 gm IV Q12H, predicted trough 15 mcg/mL. Pharmacy will continue to monitor and adjust as needed to maintain trough 15 to 20 mcg/mL.  Laural Benes, Pharm.D., BCPS Clinical Pharmacist 09/11/2017,2:57 PM

## 2017-09-11 NOTE — Transfer of Care (Signed)
Immediate Anesthesia Transfer of Care Note  Patient: Larry Cobb  Procedure(s) Performed: 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 (Right Toe)  Patient Location: PACU  Anesthesia Type:General  Level of Consciousness: awake, alert  and oriented  Airway & Oxygen Therapy: Patient Spontanous Breathing  Post-op Assessment: Report given to RN  Post vital signs: Reviewed and stable  Last Vitals:  Vitals:   09/11/17 0920 09/11/17 1109  BP: 115/65 117/70  Pulse: 65 70  Resp:  20  Temp:  (!) 36.1 C  SpO2:  97%    Last Pain:  Vitals:   09/11/17 1109  TempSrc: Tympanic      Patients Stated Pain Goal: 0 (17/47/15 9539)  Complications: No apparent anesthesia complications

## 2017-09-12 ENCOUNTER — Encounter: Payer: Self-pay | Admitting: Podiatry

## 2017-09-12 LAB — GLUCOSE, CAPILLARY
GLUCOSE-CAPILLARY: 140 mg/dL — AB (ref 65–99)
Glucose-Capillary: 121 mg/dL — ABNORMAL HIGH (ref 65–99)

## 2017-09-12 MED ORDER — METFORMIN HCL 850 MG PO TABS: 850.0000 mg | ORAL_TABLET | Freq: Two times a day (BID) | ORAL | 0 refills | Status: AC

## 2017-09-12 MED ORDER — ATORVASTATIN CALCIUM 40 MG PO TABS: 40.0000 mg | ORAL_TABLET | Freq: Every day | ORAL | 0 refills | Status: DC

## 2017-09-12 MED ORDER — AMLODIPINE BESYLATE 5 MG PO TABS: 5.0000 mg | ORAL_TABLET | Freq: Every day | ORAL | 0 refills | Status: DC

## 2017-09-12 MED ORDER — LAMOTRIGINE 150 MG PO TABS: 150.0000 mg | ORAL_TABLET | Freq: Two times a day (BID) | ORAL | 0 refills | Status: AC

## 2017-09-12 MED ORDER — CIPROFLOXACIN HCL 250 MG PO TABS: 250.0000 mg | ORAL_TABLET | Freq: Two times a day (BID) | ORAL | 0 refills | Status: AC

## 2017-09-12 MED ORDER — TRAMADOL HCL 50 MG PO TABS: 50.0000 mg | ORAL_TABLET | Freq: Four times a day (QID) | ORAL | 0 refills | Status: DC | PRN

## 2017-09-12 MED ORDER — DOXYCYCLINE MONOHYDRATE 100 MG PO TABS: 100.0000 mg | ORAL_TABLET | Freq: Two times a day (BID) | ORAL | 0 refills | Status: AC

## 2017-09-12 NOTE — Discharge Summary (Signed)
[place] [facility] [place] NAME: Larry Cobb    MR#:  [id number]  DATE OF BIRTH:  07/03/1946  DATE OF ADMISSION:  09/09/2017 ADMITTING PHYSICIAN: Bettey Costa, MD  DATE OF DISCHARGE: 09/12/2017  PRIMARY CARE PHYSICIAN: Baxter Hire, MD    ADMISSION DIAGNOSIS:  sepsis  DISCHARGE DIAGNOSIS:  Active Problems:   Foot infection   SECONDARY DIAGNOSIS:   Past Medical History:  Diagnosis Date  . Diabetes mellitus without complication ([place])   . Hypertension   . Neuropathy   . PTSD (post-traumatic stress disorder)     HOSPITAL COURSE:   71 year old male with history of diabetes and diabetic neuropathy who was sent from his PCP office with concerns of diabetic foot infection.  1. Diabetic foot infection with osteomyelitis Podiatry consultation appreciated S/p amputation of second right toe Vancomycin and Zosyn   Given a boot to prevent pressure- likely d/c tomorrow. Follow-up on labs   Give oral doxy and cipro on d/c   Follow in podiatry office. 2. Diabetes: Continue sliding scale with ADA diet Diabetes nurse consultation requested 3. Essential hypertension: Continue Norvasc  4. PTSD: Continue trazodone and Lamictal    DISCHARGE CONDITIONS:   Stable/  CONSULTS OBTAINED:    DRUG ALLERGIES:   Allergies  Allergen Reactions  . Indocin [Indomethacin] Other (See Comments)    "I bleed through my skin"    DISCHARGE MEDICATIONS:   Allergies as of 09/12/2017      Reactions   Indocin [indomethacin] Other (See Comments)   "I bleed through my skin"      Medication List    STOP taking these medications   clindamycin 300 MG capsule Commonly known as:  CLEOCIN     TAKE these medications   amLODipine 5 MG tablet Commonly known as:  NORVASC Take 1 tablet (5 mg total) by mouth daily. Start taking on:  09/13/2017   atorvastatin 40 MG tablet Commonly known as:  LIPITOR Take 1 tablet (40 mg total) by mouth  daily at 6 PM.   ciprofloxacin 250 MG tablet Commonly known as:  CIPRO Take 1 tablet (250 mg total) by mouth 2 (two) times daily for 7 days.   doxycycline 100 MG tablet Commonly known as:  ADOXA Take 1 tablet (100 mg total) by mouth 2 (two) times daily for 7 days.   lamoTRIgine 150 MG tablet Commonly known as:  LAMICTAL Take 1 tablet (150 mg total) by mouth 2 (two) times daily.   metFORMIN 850 MG tablet Commonly known as:  GLUCOPHAGE Take 1 tablet (850 mg total) by mouth 2 (two) times daily with a meal.   traMADol 50 MG tablet Commonly known as:  ULTRAM Take 1 tablet (50 mg total) by mouth every 6 (six) hours as needed for moderate pain.        DISCHARGE INSTRUCTIONS:    Follow with Podiatry in 1 week.  If you experience worsening of your admission symptoms, develop shortness of breath, life threatening emergency, suicidal or homicidal thoughts you must seek medical attention immediately by calling 911 or calling your MD immediately  if symptoms less severe.  You Must read complete instructions/literature along with all the possible adverse reactions/side effects for all the Medicines you take and that have been prescribed to you. Take any new Medicines after you have completely understood and accept all the possible adverse reactions/side effects.   Please note  You were cared for by a hospitalist during your hospital stay. If  you have any questions about your discharge medications or the care you received while you were in the hospital after you are discharged, you can call the unit and asked to speak with the hospitalist on call if the hospitalist that took care of you is not available. Once you are discharged, your primary care physician will handle any further medical issues. Please note that NO REFILLS for any discharge medications will be authorized once you are discharged, as it is imperative that you return to your primary care physician (or establish a relationship with  a primary care physician if you do not have one) for your aftercare needs so that they can reassess your need for medications and monitor your lab values.    Today   CHIEF COMPLAINT:  No chief complaint on file.   HISTORY OF PRESENT ILLNESS:  Larry Cobb  is a 71 y.o. male with a known history of PTSD, diabetic neuropathy and diabetes who presents from PCP office with concerns of right foot infection. Patient has neuropathy and is unable to feel the bottom of his foot. He noticed a few days ago of a pain sensation of his right foot. He does not recall stepping on anything. The right second toe is purple and has evidence of erythema extending towards the ankle. He has also had fevers over the past 2 days.     VITAL SIGNS:  Blood pressure 125/67, pulse 76, temperature 98.2 F (36.8 C), temperature source Oral, resp. rate 18, height 6\' 2"  (1.88 m), weight 135.2 kg (298 lb), SpO2 97 %.  I/O:    Intake/Output Summary (Last 24 hours) at 09/12/2017 1154 Last data filed at 09/12/2017 1034 Gross per 24 hour  Intake 3227.5 ml  Output 705 ml  Net 2522.5 ml    PHYSICAL EXAMINATION:  GENERAL:  71 y.o.-year-old patient lying in the bed with no acute distress.  EYES: Pupils equal, round, reactive to light and accommodation. No scleral icterus. Extraocular muscles intact.  HEENT: Head atraumatic, normocephalic. Oropharynx and nasopharynx clear.  NECK:  Supple, no jugular venous distention. No thyroid enlargement, no tenderness.  LUNGS: Normal breath sounds bilaterally, no wheezing, rales,rhonchi or crepitation. No use of accessory muscles of respiration.  CARDIOVASCULAR: S1, S2 normal. No murmurs, rubs, or gallops.  ABDOMEN: Soft, non-tender, non-distended. Bowel sounds present. No organomegaly or mass.  EXTREMITIES: No pedal edema, cyanosis, or clubbing. Dressing present on right foot, s/ amputation of 2nd toe. NEUROLOGIC: Cranial nerves II through XII are intact. Muscle strength 5/5 in  all extremities. Sensation intact. Gait not checked.  PSYCHIATRIC: The patient is alert and oriented x 3.  SKIN: No obvious rash, lesion, or ulcer.   DATA REVIEW:   CBC Recent Labs  Lab 09/11/17 0423  WBC 9.7  HGB 12.7*  HCT 36.7*  PLT 181    Chemistries  Recent Labs  Lab 09/11/17 0423 09/11/17 1309  NA 133*  --   K 3.3*  --   CL 102  --   CO2 24  --   GLUCOSE 166*  --   BUN 16  --   CREATININE 1.06 1.09  CALCIUM 8.5*  --     Cardiac Enzymes No results for input(s): TROPONINI in the last 168 hours.  Microbiology Results  Results for orders placed or performed during the hospital encounter of 09/09/17  Surgical pcr screen     Status: Abnormal   Collection Time: 09/10/17 10:00 PM  Result Value Ref Range Status   MRSA, PCR NEGATIVE  NEGATIVE Final   Staphylococcus aureus POSITIVE (A) NEGATIVE Final    Comment: (NOTE) The Xpert SA Assay (FDA approved for NASAL specimens in patients 85 years of age and older), is one component of a comprehensive surveillance program. It is not intended to diagnose infection nor to guide or monitor treatment. Performed at [facility], [address] [place], [place] [address]   Aerobic/Anaerobic Culture (surgical/deep wound)     Status: None (Preliminary result)   Collection Time: 09/11/17 12:37 PM  Result Value Ref Range Status   Specimen Description   Final    TOE Performed at [facility], [address], [place], [place] [address]    Special Requests   Final    RIGHT SECOND TOE FOR CULTURE Performed at [facility], [address] [place], [place] [address]    Gram Stain   Final    RARE WBC PRESENT, PREDOMINANTLY PMN RARE GRAM POSITIVE COCCI RARE GRAM NEGATIVE RODS Performed at [facility], [place] [address], [place], [place] [address]    Culture PENDING  Incomplete   Report Status PENDING  Incomplete    RADIOLOGY:  No results found.  EKG:  No orders found for this or any  previous visit.    Management plans discussed with the patient, family and they are in agreement.  CODE STATUS:     Code Status Orders  (From admission, onward)        Start     Ordered   09/09/17 1651  Full code  Continuous     09/09/17 1650    Code Status History    Date Active Date Inactive Code Status Order ID Comments User Context   This patient has a current code status but no historical code status.      TOTAL TIME TAKING CARE OF THIS PATIENT: 35 minutes.    Vaughan Basta M.D on 09/12/2017 at 11:54 AM  Between 7am to 6pm - Pager - [phone]  After 6pm go to [email or web address] - password [email or web address] [facility]  Office  [phone]  CC: Primary care physician; Baxter Hire, MD   Note: This dictation was prepared with Dragon dictation along with smaller phrase technology. Any transcriptional errors that result from this process are unintentional.

## 2017-09-12 NOTE — Progress Notes (Signed)
Discharge instructions reviewed with the patient.  Rx given to the patient for all of his meds. IV removed.  Patient being sent out via wheelchair with his belongings

## 2017-09-12 NOTE — Progress Notes (Signed)
Daily Progress Note   Subjective  - 1 Day Post-Op  F/u amputation. Doing well.  No pain.  Objective Vitals:   09/11/17 1411 09/11/17 1438 09/11/17 1528 09/12/17 0501  BP: (!) 107/51 109/63 114/60 118/69  Pulse: 70 77 77 67  Resp: 20 20 14 19   Temp: 97.9 F (36.6 C) 98.1 F (36.7 C) 98 F (36.7 C) 97.9 F (36.6 C)  TempSrc: Oral Oral Oral Oral  SpO2: 97% 91% 97% 96%  Weight:    135.2 kg (298 lb)  Height:        Physical Exam: Incision coapted.  Erythema decreased to foot.  Residual erythema to toe as expected.  Laboratory CBC    Component Value Date/Time   WBC 9.7 09/11/2017 0423   HGB 12.7 (L) 09/11/2017 0423   HCT 36.7 (L) 09/11/2017 0423   PLT 181 09/11/2017 0423    BMET    Component Value Date/Time   NA 133 (L) 09/11/2017 0423   K 3.3 (L) 09/11/2017 0423   CL 102 09/11/2017 0423   CO2 24 09/11/2017 0423   GLUCOSE 166 (H) 09/11/2017 0423   BUN 16 09/11/2017 0423   CREATININE 1.09 09/11/2017 1309   CALCIUM 8.5 (L) 09/11/2017 0423   GFRNONAA >60 09/11/2017 1309   GFRAA >60 09/11/2017 1309    Assessment/Planning: S/p amputation right 2nd toe   Dressing changed.  Keep clean dry and intact.  Do not change dressing unless it gets soiled.  OK from podiatry standpoint for d/c  Recommend po antibiotics. Cx growing GPC's and GNR's.  Recommend Doxy and cipro po.  Will follow cultures outpt.  OK for ambulation in post op shoe.  F/U with me on Thursday January 3rd in [place].  Call office Monday for appt.    [name] A  09/12/2017, 10:06 AM

## 2017-09-16 LAB — SURGICAL PATHOLOGY

## 2017-09-17 LAB — AEROBIC/ANAEROBIC CULTURE W GRAM STAIN (SURGICAL/DEEP WOUND)

## 2017-09-22 ENCOUNTER — Ambulatory Visit: Payer: Medicare Other

## 2017-10-12 ENCOUNTER — Ambulatory Visit
Admission: RE | Admit: 2017-10-12 | Discharge: 2017-10-12 | Disposition: A | Discharge status: Home / Self Care | Payer: Medicare Other | Source: Ambulatory Visit | Attending: Internal Medicine | Admitting: Internal Medicine

## 2017-10-12 DIAGNOSIS — I2510 Atherosclerotic heart disease of native coronary artery without angina pectoris: Secondary | ICD-10-CM | POA: Insufficient documentation

## 2017-10-12 DIAGNOSIS — I700 Atherosclerosis of aorta: Secondary | ICD-10-CM | POA: Diagnosis not present

## 2017-10-12 DIAGNOSIS — I708 Atherosclerosis of other arteries: Secondary | ICD-10-CM | POA: Diagnosis not present

## 2017-10-12 DIAGNOSIS — R1905 Periumbilic swelling, mass or lump: Secondary | ICD-10-CM | POA: Diagnosis present

## 2017-10-12 DIAGNOSIS — K439 Ventral hernia without obstruction or gangrene: Secondary | ICD-10-CM | POA: Insufficient documentation

## 2017-10-12 DIAGNOSIS — I10 Essential (primary) hypertension: Secondary | ICD-10-CM | POA: Insufficient documentation

## 2017-10-12 DIAGNOSIS — K5730 Diverticulosis of large intestine without perforation or abscess without bleeding: Secondary | ICD-10-CM | POA: Insufficient documentation

## 2017-10-12 HISTORY — DX: Malignant melanoma of skin, unspecified: C43.9

## 2017-10-12 MED ORDER — IOPAMIDOL (ISOVUE-300) INJECTION 61%
100.0000 mL | Freq: Once | INTRAVENOUS | Status: AC | PRN
  Administered 2017-10-12: 100 mL via INTRAVENOUS

## 2020-01-18 ENCOUNTER — Other Ambulatory Visit: Payer: Self-pay | Admitting: Physical Medicine & Rehabilitation

## 2020-01-18 DIAGNOSIS — M5442 Lumbago with sciatica, left side: Secondary | ICD-10-CM

## 2020-01-18 DIAGNOSIS — G8929 Other chronic pain: Secondary | ICD-10-CM

## 2020-01-28 ENCOUNTER — Ambulatory Visit
Admission: RE | Admit: 2020-01-28 | Discharge: 2020-01-28 | Disposition: A | Discharge status: Home / Self Care | Payer: Medicare Other | Source: Ambulatory Visit | Attending: Physical Medicine & Rehabilitation | Admitting: Physical Medicine & Rehabilitation

## 2020-01-28 DIAGNOSIS — G8929 Other chronic pain: Secondary | ICD-10-CM | POA: Diagnosis present

## 2020-01-28 DIAGNOSIS — M5442 Lumbago with sciatica, left side: Secondary | ICD-10-CM | POA: Insufficient documentation

## 2020-05-25 HISTORY — PX: COLONOSCOPY: SHX174

## 2020-09-11 ENCOUNTER — Other Ambulatory Visit: Payer: Self-pay | Admitting: Orthopedic Surgery

## 2020-09-19 ENCOUNTER — Encounter
Admission: RE | Admit: 2020-09-19 | Discharge: 2020-09-19 | Disposition: A | Discharge status: Home / Self Care | Payer: Medicare Other | Source: Ambulatory Visit | Attending: Orthopedic Surgery | Admitting: Orthopedic Surgery

## 2020-09-19 ENCOUNTER — Encounter: Payer: Self-pay | Admitting: Urgent Care

## 2020-09-19 ENCOUNTER — Other Ambulatory Visit: Payer: Self-pay

## 2020-09-19 DIAGNOSIS — Z6838 Body mass index (BMI) 38.0-38.9, adult: Secondary | ICD-10-CM | POA: Diagnosis not present

## 2020-09-19 DIAGNOSIS — E669 Obesity, unspecified: Secondary | ICD-10-CM | POA: Insufficient documentation

## 2020-09-19 DIAGNOSIS — E119 Type 2 diabetes mellitus without complications: Secondary | ICD-10-CM | POA: Insufficient documentation

## 2020-09-19 DIAGNOSIS — M1612 Unilateral primary osteoarthritis, left hip: Secondary | ICD-10-CM | POA: Insufficient documentation

## 2020-09-19 DIAGNOSIS — I10 Essential (primary) hypertension: Secondary | ICD-10-CM | POA: Diagnosis not present

## 2020-09-19 DIAGNOSIS — Z01818 Encounter for other preprocedural examination: Secondary | ICD-10-CM | POA: Diagnosis not present

## 2020-09-19 DIAGNOSIS — E785 Hyperlipidemia, unspecified: Secondary | ICD-10-CM | POA: Diagnosis not present

## 2020-09-19 DIAGNOSIS — G8929 Other chronic pain: Secondary | ICD-10-CM | POA: Diagnosis not present

## 2020-09-19 HISTORY — DX: Gastro-esophageal reflux disease without esophagitis: K21.9

## 2020-09-19 HISTORY — DX: Endothelial corneal dystrophy, unspecified eye: H18.519

## 2020-09-19 HISTORY — DX: Pure hypercholesterolemia, unspecified: E78.00

## 2020-09-19 LAB — URINALYSIS, ROUTINE W REFLEX MICROSCOPIC
Bilirubin Urine: NEGATIVE
Glucose, UA: NEGATIVE mg/dL
Hgb urine dipstick: NEGATIVE
Ketones, ur: NEGATIVE mg/dL
Leukocytes,Ua: NEGATIVE
Nitrite: NEGATIVE
Protein, ur: NEGATIVE mg/dL
Specific Gravity, Urine: 1.017 (ref 1.005–1.030)
pH: 5 (ref 5.0–8.0)

## 2020-09-19 LAB — COMPREHENSIVE METABOLIC PANEL
ALT: 19 U/L (ref 0–44)
AST: 18 U/L (ref 15–41)
Albumin: 4 g/dL (ref 3.5–5.0)
Alkaline Phosphatase: 66 U/L (ref 38–126)
Anion gap: 9 (ref 5–15)
BUN: 21 mg/dL (ref 8–23)
CO2: 27 mmol/L (ref 22–32)
Calcium: 9 mg/dL (ref 8.9–10.3)
Chloride: 100 mmol/L (ref 98–111)
Creatinine, Ser: 1.15 mg/dL (ref 0.61–1.24)
GFR, Estimated: >60 mL/min (ref >60)
Glucose, Bld: 109 mg/dL — ABNORMAL HIGH (ref 70–99)
Potassium: 3.7 mmol/L (ref 3.5–5.1)
Sodium: 136 mmol/L (ref 135–145)
Total Bilirubin: 1.3 mg/dL — ABNORMAL HIGH (ref 0.3–1.2)
Total Protein: 7.5 g/dL (ref 6.5–8.1)

## 2020-09-19 LAB — CBC WITH DIFFERENTIAL/PLATELET
Abs Immature Granulocytes: 0.03 10*3/uL (ref 0.00–0.07)
Basophils Absolute: 0.1 10*3/uL (ref 0.0–0.1)
Basophils Relative: 1 %
Eosinophils Absolute: 0.3 10*3/uL (ref 0.0–0.5)
Eosinophils Relative: 4 %
HCT: 41.3 % (ref 39.0–52.0)
Hemoglobin: 14.6 g/dL (ref 13.0–17.0)
Immature Granulocytes: 0 %
Lymphocytes Relative: 29 %
Lymphs Abs: 2.4 10*3/uL (ref 0.7–4.0)
MCH: 32.1 pg (ref 26.0–34.0)
MCHC: 35.4 g/dL (ref 30.0–36.0)
MCV: 90.8 fL (ref 80.0–100.0)
Monocytes Absolute: 0.7 10*3/uL (ref 0.1–1.0)
Monocytes Relative: 8 %
Neutro Abs: 4.7 10*3/uL (ref 1.7–7.7)
Neutrophils Relative %: 58 %
Platelets: 197 10*3/uL (ref 150–400)
RBC: 4.55 MIL/uL (ref 4.22–5.81)
RDW: 12.1 % (ref 11.5–15.5)
WBC: 8.3 10*3/uL (ref 4.0–10.5)
nRBC: 0 % (ref 0.0–0.2)

## 2020-09-19 LAB — TYPE AND SCREEN
ABO/RH(D): O POS
Antibody Screen: NEGATIVE

## 2020-09-19 LAB — SURGICAL PCR SCREEN
MRSA, PCR: NEGATIVE
Staphylococcus aureus: NEGATIVE

## 2020-09-19 NOTE — Progress Notes (Signed)
  [facility] Perioperative Services: Pre-Admission/Anesthesia Testing     Date: 09/19/20  Name: Larry Cobb MRN:   [id number]  Re: Need for presurgical cardiac evaluation and clearance   Case: [id number] Date/Time: 09/27/20 3810   Procedure: TOTAL HIP ARTHROPLASTY ANTERIOR APPROACH (Left Hip)   Anesthesia type: Choice   Pre-op diagnosis:      Chronic hip pain, left M25.552, G89.29     Primary osteoarthritis of left hip M16.12   Location: [facility] OR ROOM 01 / [facility] ORS FOR ANESTHESIA GROUP   Surgeons: Kennedy Bucker, MD    Patient scheduled for the above procedure on 09/27/2020 with Dr. Kennedy Bucker. In preparation for patient procedure patient presented to PAT clinic on 09/19/2020 for presurgical testing. As part of his presurgical testing, patient had an ECG performed that showed atrial fibrillation at a controlled rate of 63 bpm. There was no axis deviation, ectopy, or ST/T wave abnormalities. There are no previous ECGs in the [facility] system or in Care Everywhere for review and comparison. Patient denies known PMH of atrial fibrillation or other known cardiac disease.  PMH (+) for HTN, HLD, T2DM, and obesity (BMI 38.0 kg/m).  Patient is asymptomatic; denies chest pain, shortness of breath, PND, orthopnea, palpitations, vertiginous symptoms, or presyncope/syncope. ECG reviewed with patient and discussed implications that acute finding has on his upcoming procedure. Patient advised that he will need to see cardiology for evaluation and clearance prior to proceeding.  Patient has no preference on cardiologist; desires first available.  Call placed to [facility], however there is no availability that we will accommodate patient being seen prior to her scheduled surgery date.  Call been placed to [facility] cardiology in efforts to secure patient an appointment for evaluation.  Patient able to be seen at [facility] on 09/24/2020 at 11:15 AM by Dr. Marcina Millard. We very much appreciated the efforts being made to accommodate the request for expedited consult. Appointment information and details provided to patient.  Discussed that ability to proceed with upcoming planned elective orthopedic procedure will be based on cardiology evaluation and subsequent clearance.  No changes are being made to the surgical schedule at this point; patient being left on schedule for 09/27/2020 as scheduled with the understanding that this may need to be postponed.  Call placed surgery scheduler (Tiffany, CMA) at [facility] to make them aware; LMOM.    In light of acute findings on today's ECG, patient was provided with return precautions; discussed when to urgently/emergently seek medical care.  Patient advised call 911 or present to the ED if he develops any chest pain, shortness of breath, weakness, focal neurological symptoms, palpitations, or any other concerning symptoms.  Will forward copy of this note to both attending surgeon Rosita Kea, MD) and to the consulting cardiologist Darrold Junker, MD) for review.     Quentin Mulling, MSN, APRN, FNP-C, CEN [facility]  Peri-operative Services Nurse Practitioner Phone: [phone] 09/19/20 3:35 PM

## 2020-09-19 NOTE — Patient Instructions (Signed)
Your procedure is scheduled on: Thursday, January 13 Report to the Registration Desk on the 1st floor of the [facility]. To find out your arrival time, please call [phone] between 1PM - 3PM on: Wednesday, January 12  REMEMBER: Instructions that are not followed completely may result in serious medical risk, up to and including death; or upon the discretion of your surgeon and anesthesiologist your surgery may need to be rescheduled.  Do not eat food after midnight the night before surgery.  No gum chewing, lozengers or hard candies.  You may however, drink water up to 2 hours before you are scheduled to arrive for your surgery. Do not drink anything within 2 hours of your scheduled arrival time.  TAKE THESE MEDICATIONS THE MORNING OF SURGERY WITH A SIP OF WATER:  1.  Amlodipine 2.  Lamotrigine  3.  Omeprazole - (take one the night before and one on the morning of surgery - helps to prevent nausea after surgery.)  Stop Metformin 2 days prior to surgery. Last day to take is Monday, January 10  One week prior to surgery: starting January 6 Stop Anti-inflammatories (NSAIDS) such as Advil, Aleve, Ibuprofen, Motrin, Naproxen, Naprosyn and Aspirin based products such as Excedrin, Goodys Powder, BC Powder. Stop ANY OVER THE COUNTER supplements until after surgery. (However, you may continue taking Vitamin D and multivitamin up until the day before surgery.)  No Alcohol for 24 hours before or after surgery.  No Smoking including e-cigarettes for 24 hours prior to surgery.  No chewable tobacco products for at least 6 hours prior to surgery.  No nicotine patches on the day of surgery.  Do not use any "recreational" drugs for at least a week prior to your surgery.  Please be advised that the combination of cocaine and anesthesia may have negative outcomes, up to and including death. If you test positive for cocaine, your surgery will be cancelled.  On the morning of surgery brush your  teeth with toothpaste and water, you may rinse your mouth with mouthwash if you wish. Do not swallow any toothpaste or mouthwash.  Do not wear jewelry, make-up, hairpins, clips or nail polish.  Do not wear lotions, powders, or perfumes.   Do not shave body from the neck down 48 hours prior to surgery just in case you cut yourself which could leave a site for infection.  Also, freshly shaved skin may become irritated if using the CHG soap.  Contact lenses, hearing aids and dentures may not be worn into surgery.  Do not bring valuables to the hospital. [facility] is not responsible for any missing/lost belongings or valuables.   Use CHG Soap as directed on instruction sheet.  Notify your doctor if there is any change in your medical condition (cold, fever, infection).  Wear comfortable clothing (specific to your surgery type) to the hospital.  Plan for stool softeners for home use; pain medications have a tendency to cause constipation. You can also help prevent constipation by eating foods high in fiber such as fruits and vegetables and drinking plenty of fluids as your diet allows.  After surgery, you can help prevent lung complications by doing breathing exercises.  Take deep breaths and cough every 1-2 hours. Your doctor may order a device called an Incentive Spirometer to help you take deep breaths.  If you are being admitted to the hospital overnight, leave your suitcase in the car. After surgery it may be brought to your room.  If you are being  discharged the day of surgery, you will not be allowed to drive home. You will need a responsible adult (18 years or older) to drive you home and stay with you that night.   If you are taking public transportation, you will need to have a responsible adult (18 years or older) with you. Please confirm with your physician that it is acceptable to use public transportation.   Please call the Pre-admissions Testing Dept. at [phone]  if you have any questions about these instructions.  Visitation Policy:  Patients undergoing a surgery or procedure may have one family member or support person with them as long as that person is not COVID-19 positive or experiencing its symptoms.  That person may remain in the waiting area during the procedure.  Inpatient Visitation:    Visiting hours are 7 a.m. to 8 p.m. Patients will be allowed one visitor. The visitor may change daily. The visitor must pass COVID-19 screenings, use hand sanitizer when entering and exiting the patient's room and wear a mask at all times, including in the patient's room. Patients must also wear a mask when staff or their visitor are in the room. Masking is required regardless of vaccination status. Systemwide, no visitors 17 or younger.

## 2020-09-20 ENCOUNTER — Ambulatory Visit: Payer: No Typology Code available for payment source | Admitting: Interventional Cardiology

## 2020-09-25 ENCOUNTER — Other Ambulatory Visit: Admission: RE | Admit: 2020-09-25 | Payer: No Typology Code available for payment source | Source: Ambulatory Visit

## 2020-09-27 ENCOUNTER — Inpatient Hospital Stay: Admit: 2020-09-27 | Payer: No Typology Code available for payment source | Admitting: Orthopedic Surgery

## 2020-09-27 ENCOUNTER — Encounter: Admission: RE | Payer: Self-pay | Source: Home / Self Care

## 2020-09-27 ENCOUNTER — Inpatient Hospital Stay: Admission: RE | Admit: 2020-09-27 | Payer: Medicare Other | Source: Home / Self Care | Admitting: Orthopedic Surgery

## 2020-09-27 SURGERY — ARTHROPLASTY, HIP, TOTAL, ANTERIOR APPROACH
Anesthesia: Choice | Site: Hip | Laterality: Left

## 2020-11-05 ENCOUNTER — Other Ambulatory Visit: Payer: Self-pay | Admitting: Orthopedic Surgery

## 2020-11-08 ENCOUNTER — Encounter
Admission: RE | Admit: 2020-11-08 | Discharge: 2020-11-08 | Disposition: A | Discharge status: Home / Self Care | Payer: Medicare Other | Source: Ambulatory Visit | Attending: Orthopedic Surgery | Admitting: Orthopedic Surgery

## 2020-11-08 ENCOUNTER — Other Ambulatory Visit: Payer: Self-pay

## 2020-11-08 DIAGNOSIS — Z01818 Encounter for other preprocedural examination: Secondary | ICD-10-CM | POA: Diagnosis not present

## 2020-11-08 DIAGNOSIS — I4891 Unspecified atrial fibrillation: Secondary | ICD-10-CM | POA: Insufficient documentation

## 2020-11-08 HISTORY — DX: Pneumonia, unspecified organism: J18.9

## 2020-11-08 HISTORY — DX: Unspecified atrial fibrillation: I48.91

## 2020-11-08 LAB — CBC WITH DIFFERENTIAL/PLATELET
Abs Immature Granulocytes: 0.03 10*3/uL (ref 0.00–0.07)
Basophils Absolute: 0.1 10*3/uL (ref 0.0–0.1)
Basophils Relative: 1 %
Eosinophils Absolute: 0.2 10*3/uL (ref 0.0–0.5)
Eosinophils Relative: 3 %
HCT: 43.5 % (ref 39.0–52.0)
Hemoglobin: 15.3 g/dL (ref 13.0–17.0)
Immature Granulocytes: 0 %
Lymphocytes Relative: 25 %
Lymphs Abs: 1.9 10*3/uL (ref 0.7–4.0)
MCH: 31.5 pg (ref 26.0–34.0)
MCHC: 35.2 g/dL (ref 30.0–36.0)
MCV: 89.5 fL (ref 80.0–100.0)
Monocytes Absolute: 0.6 10*3/uL (ref 0.1–1.0)
Monocytes Relative: 7 %
Neutro Abs: 5.1 10*3/uL (ref 1.7–7.7)
Neutrophils Relative %: 64 %
Platelets: 198 10*3/uL (ref 150–400)
RBC: 4.86 MIL/uL (ref 4.22–5.81)
RDW: 12.1 % (ref 11.5–15.5)
WBC: 7.9 10*3/uL (ref 4.0–10.5)
nRBC: 0 % (ref 0.0–0.2)

## 2020-11-08 LAB — COMPREHENSIVE METABOLIC PANEL
ALT: 22 U/L (ref 0–44)
AST: 20 U/L (ref 15–41)
Albumin: 4.2 g/dL (ref 3.5–5.0)
Alkaline Phosphatase: 65 U/L (ref 38–126)
Anion gap: 10 (ref 5–15)
BUN: 21 mg/dL (ref 8–23)
CO2: 25 mmol/L (ref 22–32)
Calcium: 9.5 mg/dL (ref 8.9–10.3)
Chloride: 101 mmol/L (ref 98–111)
Creatinine, Ser: 1.31 mg/dL — ABNORMAL HIGH (ref 0.61–1.24)
GFR, Estimated: 57 mL/min — ABNORMAL LOW (ref >60)
Glucose, Bld: 112 mg/dL — ABNORMAL HIGH (ref 70–99)
Potassium: 3.7 mmol/L (ref 3.5–5.1)
Sodium: 136 mmol/L (ref 135–145)
Total Bilirubin: 1.8 mg/dL — ABNORMAL HIGH (ref 0.3–1.2)
Total Protein: 7.6 g/dL (ref 6.5–8.1)

## 2020-11-08 LAB — URINALYSIS, ROUTINE W REFLEX MICROSCOPIC
Bilirubin Urine: NEGATIVE
Glucose, UA: NEGATIVE mg/dL
Hgb urine dipstick: NEGATIVE
Ketones, ur: NEGATIVE mg/dL
Leukocytes,Ua: NEGATIVE
Nitrite: NEGATIVE
Protein, ur: NEGATIVE mg/dL
Specific Gravity, Urine: 1.017 (ref 1.005–1.030)
pH: 6 (ref 5.0–8.0)

## 2020-11-08 LAB — TYPE AND SCREEN
ABO/RH(D): O POS
Antibody Screen: NEGATIVE

## 2020-11-08 LAB — SURGICAL PCR SCREEN
MRSA, PCR: NEGATIVE
Staphylococcus aureus: NEGATIVE

## 2020-11-08 NOTE — Patient Instructions (Addendum)
Your procedure is scheduled on: 11/13/20- Tuesday Report to the Registration Desk on the 1st floor of the [facility]. To find out your arrival time, please call [phone] between 1PM - 3PM on: 11/12/20- Monday  REMEMBER: Instructions that are not followed completely may result in serious medical risk, up to and including death; or upon the discretion of your surgeon and anesthesiologist your surgery may need to be rescheduled.  Do not eat food after midnight the night before surgery.  No gum chewing, lozengers or hard candies.  You may however, drink CLEAR liquids up to 2 hours before you are scheduled to arrive for your surgery. Do not drink anything within 2 hours of your scheduled arrival time.Type 1 and Type 2 diabetics should only drink water.  TAKE THESE MEDICATIONS THE MORNING OF SURGERY WITH A SIP OF WATER:  - amLODipine (NORVASC) 10 MG tablet - lamoTRIgine (LAMICTAL) 150 MG tablet - omeprazole (PRILOSEC) 20 MG capsule, take one the night before and one on the morning of surgery - helps to prevent nausea after surgery. - prednisoLONE acetate (PRED FORTE) 1 % ophthalmic suspension  Stop Metformin 2 days prior to surgery. Do not take 02/27, 02/28 or the morning of surgery.  Follow recommendations from Cardiologist, Pulmonologist or PCP regarding stopping Aspirin, Coumadin, Plavix, Eliquis, Pradaxa, or Pletal. Do not take Eliquis 02/26, 02/27, 02/28 and do not take the morning of surgery.  One week prior to surgery: Stop Anti-inflammatories (NSAIDS) such as Advil, Aleve, Ibuprofen, Motrin, Naproxen, Naprosyn and Aspirin based products such as Excedrin, Goodys Powder, BC Powder.  Stop ANY OVER THE COUNTER supplements until after surgery.  No Alcohol for 24 hours before or after surgery.  No Smoking including e-cigarettes for 24 hours prior to surgery.  No chewable tobacco products for at least 6 hours prior to surgery.  No nicotine patches on the day of surgery.  Do not  use any "recreational" drugs for at least a week prior to your surgery.  Please be advised that the combination of cocaine and anesthesia may have negative outcomes, up to and including death. If you test positive for cocaine, your surgery will be cancelled.  On the morning of surgery brush your teeth with toothpaste and water, you may rinse your mouth with mouthwash if you wish. Do not swallow any toothpaste or mouthwash.  Do not wear jewelry, make-up, hairpins, clips or nail polish.  Do not wear lotions, powders, or perfumes.   Do not shave body from the neck down 48 hours prior to surgery just in case you cut yourself which could leave a site for infection.  Also, freshly shaved skin may become irritated if using the CHG soap.  Contact lenses, hearing aids and dentures may not be worn into surgery.  Do not bring valuables to the hospital. [facility] is not responsible for any missing/lost belongings or valuables.   Use CHG Soap or wipes as directed on instruction sheet.  Notify your doctor if there is any change in your medical condition (cold, fever, infection).  Wear comfortable clothing (specific to your surgery type) to the hospital.  Plan for stool softeners for home use; pain medications have a tendency to cause constipation. You can also help prevent constipation by eating foods high in fiber such as fruits and vegetables and drinking plenty of fluids as your diet allows.  After surgery, you can help prevent lung complications by doing breathing exercises.  Take deep breaths and cough every 1-2 hours. Your doctor may  order a device called an Incentive Spirometer to help you take deep breaths. When coughing or sneezing, hold a pillow firmly against your incision with both hands. This is called "splinting." Doing this helps protect your incision. It also decreases belly discomfort.  If you are being admitted to the hospital overnight, leave your suitcase in the car. After  surgery it may be brought to your room.  If you are being discharged the day of surgery, you will not be allowed to drive home. You will need a responsible adult (18 years or older) to drive you home and stay with you that night.   If you are taking public transportation, you will need to have a responsible adult (18 years or older) with you. Please confirm with your physician that it is acceptable to use public transportation.   Please call the [place] Dept. at [phone] if you have any questions about these instructions.  Visitation Policy:  Patients undergoing a surgery or procedure may have one family member or support person with them as long as that person is not COVID-19 positive or experiencing its symptoms.  That person may remain in the waiting area during the procedure.  Inpatient Visitation:    Visiting hours are 7 a.m. to 8 p.m. Patients will be allowed one visitor. The visitor may change daily. The visitor must pass COVID-19 screenings, use hand sanitizer when entering and exiting the patient's room and wear a mask at all times, including in the patient's room. Patients must also wear a mask when staff or their visitor are in the room. Masking is required regardless of vaccination status. Systemwide, no visitors 17 or younger.  Visitation Policy Changes: The following changes are to take effect on Feb. 28 at 7 a.m.  No visitors under the age of 23. Any visitor under the age of 4 must be accompanied by an adult. Adult inpatients: Two visitors will be allowed daily and the visitors may change each day during the patient's stay. (Please note -- no changes at this time for the [facility]'s & [place], Children's Emergency Department and inpatients, Emergency Departments, Ambulatory Sites, [facility], medical practices and procedural areas.)

## 2020-11-08 NOTE — Progress Notes (Signed)
Perioperative Services  Pre-Admission/Anesthesia Testing Clinical Review  Date: 11/09/20  Patient Demographics:  Name: Larry Cobb DOB:   1946-04-30 MRN:   [id number]  Planned Surgical Procedure(s):    Case: [id number] Date/Time: 11/13/20 0700   Procedure: TOTAL HIP ARTHROPLASTY ANTERIOR APPROACH (Left Hip)   Anesthesia type: Choice   Pre-op diagnosis:      Chronic hip pain, left M25.552, G89.29     Primary osteoarthritis of left hip M16.12   Location: [facility] OR ROOM 01 / [place] [facility]   Surgeons: Hessie Knows, MD    NOTE: Available PAT nursing documentation and vital signs have been reviewed. Clinical nursing staff has updated patient's PMH/PSHx, current medication list, and drug allergies/intolerances to ensure comprehensive history available to assist in medical decision making as it pertains to the aforementioned surgical procedure and anticipated anesthetic course.   Clinical Discussion:  Larry Cobb is a 75 y.o. male who is submitted for pre-surgical anesthesia review and clearance prior to him undergoing the above procedure. Patient is a Former Smoker (100 pack years; quit 09/1990). Pertinent PMH includes: atrial fibrillation, HTN, HLD, T2DM, GERD (on daily PPI).  Patient is followed by cardiology [name], MD). He was last seen in the cardiology clinic on 10/08/2020; notes reviewed.  At the time of his clinic visit, patient noted to be doing well overall from a cardiovascular perspective. He denied chest pain, shortness of breath, orthopnea, PND, palpitations, peripheral edema, vertiginous symptoms, and presyncope/syncope.  Patient previously seen by cardiology following preoperative ECG performed on 09/19/2020 that revealed new onset atrial fibrillation.  At that time, patient complained of mild chronic exertional dyspnea.  Cardiology noted a history of catheter ablation for unspecified aberrant conduction 35 years ago; discharge from cardiology following  ablation with no recurrent issues. CHA2DS2-VASc Score = 3.  72-hour cardiac event monitor study revealed a predominantly underlying atrial fibrillation with a mean heart rate of 69 bpm; range 47-118 bpm.  There were occasional PVCs noted.  Subsequent TTE performed on 10/04/2020 revealed normal left ventricular systolic function (EF 91%) with mild valvular insufficiency (see full interpretation of cardiovascular testing below). Patient on GDMT for his HTN diagnosis.  Blood pressure well controlled at 122/68 on currently prescribed CCB monotherapy. Patient is on risk factor modification therapy for his HLD. T2DM well controlled on currently prescribed regimen; Hgb A1c 6.2% when last checked on 07/03/2020.  Patient was started on apixaban twice daily for CVA prevention.  Low-dose ASA was discontinued.  PSG was recommended, however patient elected to defer at this time.  No other changes were made to patient's medication regimen.  Patient to follow-up with outpatient cardiology in 3 months or sooner if needed.  Patient scheduled to undergo an elective orthopedic procedure on 11/13/2020 with Dr. Hessie Knows.  Given patient's past medical history significant for cardiovascular diagnoses, presurgical cardiac clearance was sought by the performing surgeon's office and PAT team. Per cardiology, "this patient is optimized for surgery and may proceed with the planned procedural course with a ACCEPTABLE risk stratification". This patient is on daily anticoagulation. He has been instructed on recommendations for holding his apixaban for 3 days prior to his procedure with plans to restart as soon as postoperative bleeding risk felt to be minimized by his attending surgeon. The patient has been instructed that his last dose of his anticoagulant will be on 11/09/2020.  Patient denies previous perioperative complications with anesthesia in the past. In review of the available records, it is noted that patient underwent  a  general anesthetic course here (ASA III) in 08/2017 without documented complications.   Vitals with BMI 09/19/2020 09/12/2017 09/12/2017  Height 6\' 2"  - -  Weight 296 lbs - 298 lbs  BMI 14.78 - 29.56  Systolic 213 086 578  Diastolic 83 67 69  Pulse 77 76 67    Providers/Specialists:   NOTE: Primary physician provider listed below. Patient may have been seen by APP or partner within same practice.   PROVIDER ROLE / SPECIALTY LAST Fabio Bering, MD Orthopedics (Surgeon)  11/07/2020  Baxter Hire, MD Primary Care Provider  07/10/2020  Isaias Cowman, MD Cardiology  10/08/2020   Allergies:  Indocin [indomethacin]  Current Home Medications:   No current facility-administered medications for this encounter.   Marland Kitchen amLODipine (NORVASC) 10 MG tablet  . apixaban (ELIQUIS) 5 MG TABS tablet  . ARIPiprazole (ABILIFY) 10 MG tablet  . Cholecalciferol 25 MCG (1000 UT) tablet  . lamoTRIgine (LAMICTAL) 150 MG tablet  . metFORMIN (GLUCOPHAGE) 850 MG tablet  . Multiple Vitamins-Minerals (MULTIVITAMIN WITH MINERALS) tablet  . omeprazole (PRILOSEC) 20 MG capsule  . prednisoLONE acetate (PRED FORTE) 1 % ophthalmic suspension  . traZODone (DESYREL) 100 MG tablet  . triamterene-hydrochlorothiazide (MAXZIDE) 75-50 MG tablet   History:   Past Medical History:  Diagnosis Date  . A-fib ([place])   . Diabetes mellitus without complication ([place])   . Fuchs' corneal dystrophy   . GERD (gastroesophageal reflux disease)   . Hypercholesteremia   . Hypertension   . Melanoma of skin ([place])    Resected from his nose in 1980's  . Neuropathy   . Pneumonia   . PTSD (post-traumatic stress disorder)    Past Surgical History:  Procedure Laterality Date  . ABLATION  1992   cardiac  . AMPUTATION TOE Right 09/11/2017   Procedure: 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;  Surgeon: [name], DPM;  Location: [facility] ORS;  Service: Podiatry;  Laterality: Right;  . APPENDECTOMY  1972  . CATARACT EXTRACTION  Right 10/21/2016  . COLON SURGERY  2006   removed a foot of colon  . COLONOSCOPY  05/25/2020  . 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 EYE SURGERY    . FOREIGN BODY REMOVAL     shrapnel in neck  . HERNIA REPAIR  4696   umbilical  . JOINT REPLACEMENT Left    shoulder  . TONSILLECTOMY     as a child  . TOTAL SHOULDER REPLACEMENT Left 2010   Family History  Problem Relation Age of Onset  . Breast cancer Mother   . Diabetes Mother    Social History   Tobacco Use  . Smoking status: Former Smoker    Types: Cigarettes    Quit date: 1992    Years since quitting: 30.1  . Smokeless tobacco: Never Used  Vaping Use  . Vaping Use: Never used  Substance Use Topics  . Alcohol use: No  . Drug use: No    Pertinent Clinical Results:  LABS: Labs reviewed: Acceptable for surgery.  Hospital Outpatient Visit on 11/08/2020  Component Date Value Ref Range Status  . MRSA, PCR 11/08/2020 NEGATIVE  NEGATIVE Final  . Staphylococcus aureus 11/08/2020 NEGATIVE  NEGATIVE Final   Comment: (NOTE) The Xpert SA Assay (FDA approved for NASAL specimens in patients 61 years of age and older), is one component of a comprehensive surveillance program. It is not intended to diagnose infection nor to guide or monitor treatment. Performed at [facility], [place],  [place], [place] [address]   . WBC 11/08/2020 7.9  4.0 - 10.5 K/uL Final  . RBC 11/08/2020 4.86  4.22 - 5.81 MIL/uL Final  . Hemoglobin 11/08/2020 15.3  13.0 - 17.0 g/dL Final  . HCT 11/08/2020 43.5  39.0 - 52.0 % Final  . MCV 11/08/2020 89.5  80.0 - 100.0 fL Final  . MCH 11/08/2020 31.5  26.0 - 34.0 pg Final  . MCHC 11/08/2020 35.2  30.0 - 36.0 g/dL Final  . RDW 11/08/2020 12.1  11.5 - 15.5 % Final  . Platelets 11/08/2020 198  150 - 400 K/uL Final  . nRBC 11/08/2020 0.0  0.0 - 0.2 % Final  . Neutrophils Relative % 11/08/2020 64  % Final  . Neutro Abs 11/08/2020 5.1  1.7 - 7.7 K/uL Final  . Lymphocytes Relative  11/08/2020 25  % Final  . Lymphs Abs 11/08/2020 1.9  0.7 - 4.0 K/uL Final  . Monocytes Relative 11/08/2020 7  % Final  . Monocytes Absolute 11/08/2020 0.6  0.1 - 1.0 K/uL Final  . Eosinophils Relative 11/08/2020 3  % Final  . Eosinophils Absolute 11/08/2020 0.2  0.0 - 0.5 K/uL Final  . Basophils Relative 11/08/2020 1  % Final  . Basophils Absolute 11/08/2020 0.1  0.0 - 0.1 K/uL Final  . Immature Granulocytes 11/08/2020 0  % Final  . Abs Immature Granulocytes 11/08/2020 0.03  0.00 - 0.07 K/uL Final   Performed at [facility], [address], [place], [place] [address]  . Sodium 11/08/2020 136  135 - 145 mmol/L Final  . Potassium 11/08/2020 3.7  3.5 - 5.1 mmol/L Final  . Chloride 11/08/2020 101  98 - 111 mmol/L Final  . CO2 11/08/2020 25  22 - 32 mmol/L Final  . Glucose, Bld 11/08/2020 112* 70 - 99 mg/dL Final   Glucose reference range applies only to samples taken after fasting for at least 8 hours.  . BUN 11/08/2020 21  8 - 23 mg/dL Final  . Creatinine, Ser 11/08/2020 1.31* 0.61 - 1.24 mg/dL Final  . Calcium 11/08/2020 9.5  8.9 - 10.3 mg/dL Final  . Total Protein 11/08/2020 7.6  6.5 - 8.1 g/dL Final  . Albumin 11/08/2020 4.2  3.5 - 5.0 g/dL Final  . AST 11/08/2020 20  15 - 41 U/L Final  . ALT 11/08/2020 22  0 - 44 U/L Final  . Alkaline Phosphatase 11/08/2020 65  38 - 126 U/L Final  . Total Bilirubin 11/08/2020 1.8* 0.3 - 1.2 mg/dL Final  . GFR, Estimated 11/08/2020 57* >60 mL/min Final   Comment: (NOTE) Calculated using the CKD-EPI Creatinine Equation (2021)   . Anion gap 11/08/2020 10  5 - 15 Final   Performed at [facility], [address] [place], [place] [address]  . Color, Urine 11/08/2020 YELLOW* YELLOW Final  . APPearance 11/08/2020 HAZY* CLEAR Final  . Specific Gravity, Urine 11/08/2020 1.017  1.005 - 1.030 Final  . pH 11/08/2020 6.0  5.0 - 8.0 Final  . Glucose, UA 11/08/2020 NEGATIVE  NEGATIVE mg/dL Final  . Hgb urine dipstick 11/08/2020 NEGATIVE   NEGATIVE Final  . Bilirubin Urine 11/08/2020 NEGATIVE  NEGATIVE Final  . Ketones, ur 11/08/2020 NEGATIVE  NEGATIVE mg/dL Final  . Protein, ur 11/08/2020 NEGATIVE  NEGATIVE mg/dL Final  . Nitrite 11/08/2020 NEGATIVE  NEGATIVE Final  . Chalmers Guest 11/08/2020 NEGATIVE  NEGATIVE Final   Performed at [facility], [address] [place],  [address]  . ABO/RH(D) 11/08/2020 O POS  Final  . Antibody Screen 11/08/2020 NEG   Final  . Sample Expiration 11/08/2020 11/22/2020,2359   Final  . Extend sample reason 11/08/2020    Final                   Value:NO TRANSFUSIONS OR PREGNANCY IN THE PAST 3 MONTHS Performed at [facility], [address] [place], [place] [address]     ECG: Date: 11/08/2020 Time ECG obtained: 1133 AM Rate: 64 bpm Rhythm: atrial fibrillation Axis (leads I and aVF): Normal Intervals: QRS 100 ms. QTc 435 ms. ST segment and T wave changes: No evidence of acute ST segment elevation or depression Comparison: Similar to previous tracing obtained on 09/19/2020   IMAGING / PROCEDURES: ECHOCARDIOGRAM performed on 09/26/2020 1. EF 50% 2. Normal left ventricular systolic function 3. Normal right ventricular systolic function 4. Mild MR and TR 5. No AR or PR 6. No valvular stenosis 7. No evidence of pericardial effusion  DIAGNOSTIC RADIOGRAPH HIP LEFT 2 OR 3 VIEW WITH OR WITHOUT PELVIS performed on 07/13/2020 1. Severe left hip osteoarthritis with subchondral cyst formation of the superior acetabulum with severe sclerotic changes noted 2. No evidence of acute bony abnormality 3. Spurring along the inferior acetabulum 4. Pelvic rings are intact 5. Mild to moderate arthritic changes throughout the right hip  MRI LUMBAR SPINE WITHOUT CONTRAST performed on 01/28/2020 1. Diffuse lumbar spine degeneration with dextroscoliosis 2. Right foraminal impingement at L4-L5 and L5-S1 3. On the left there is a mild far lateral L3 nerve root distortion due  to bulging disc 4. L3-L4 and L4-L5 mild spinal stenosis  Impression and Plan:  Larry Cobb has been referred for pre-anesthesia review and clearance prior to him undergoing the planned anesthetic and procedural courses. Available labs, pertinent testing, and imaging results were personally reviewed by me. This patient has been appropriately cleared by cardiology with an overall ACCEPTABLE risk of significant perioperative cardiovascular complications.  Based on clinical review performed today (11/09/20), barring any significant acute changes in the patient's overall condition, it is anticipated that he will be able to proceed with the planned surgical intervention. Any acute changes in clinical condition may necessitate his procedure being postponed and/or cancelled. Pre-surgical instructions were reviewed with the patient during his PAT appointment and questions were fielded by PAT clinical staff.  Honor Loh, MSN, APRN, FNP-C, [facility]  Peri-operative Services Nurse Practitioner Phone: [phone] 11/09/20 9:41 AM  NOTE: This note has been prepared using Dragon dictation software. Despite my best ability to proofread, there is always the potential that unintentional transcriptional errors may still occur from this process.

## 2020-11-12 ENCOUNTER — Other Ambulatory Visit: Payer: Self-pay

## 2020-11-12 ENCOUNTER — Other Ambulatory Visit
Admission: RE | Admit: 2020-11-12 | Discharge: 2020-11-12 | Disposition: A | Discharge status: Home / Self Care | Payer: Medicare Other | Source: Ambulatory Visit | Attending: Orthopedic Surgery | Admitting: Orthopedic Surgery

## 2020-11-12 DIAGNOSIS — Z01812 Encounter for preprocedural laboratory examination: Secondary | ICD-10-CM | POA: Diagnosis present

## 2020-11-12 DIAGNOSIS — Z20822 Contact with and (suspected) exposure to covid-19: Secondary | ICD-10-CM | POA: Insufficient documentation

## 2020-11-12 MED ORDER — DEXTROSE 5 % IV SOLN
3.0000 g | INTRAVENOUS | Status: AC
  Administered 2020-11-13: 3 g via INTRAVENOUS
  Filled 2020-11-12: qty 3000

## 2020-11-12 MED ORDER — DEXTROSE 5 % IV SOLN
3.0000 g | INTRAVENOUS | Status: DC
  Filled 2020-11-12: qty 3000

## 2020-11-12 MED ORDER — ORAL CARE MOUTH RINSE: 15.0000 mL | Freq: Once | OROMUCOSAL | Status: AC

## 2020-11-12 MED ORDER — SODIUM CHLORIDE 0.9 % IV SOLN: INTRAVENOUS | Status: DC

## 2020-11-12 MED ORDER — CHLORHEXIDINE GLUCONATE 0.12 % MT SOLN: 15.0000 mL | Freq: Once | OROMUCOSAL | Status: AC

## 2020-11-13 ENCOUNTER — Inpatient Hospital Stay: Payer: Medicare Other

## 2020-11-13 ENCOUNTER — Inpatient Hospital Stay: Payer: Medicare Other | Admitting: Urgent Care

## 2020-11-13 ENCOUNTER — Encounter
Admission: RE | Disposition: A | Discharge status: Home / Self Care | Payer: No Typology Code available for payment source | Source: Home / Self Care | Attending: Orthopedic Surgery

## 2020-11-13 ENCOUNTER — Observation Stay
Admission: RE | Admit: 2020-11-13 | Discharge: 2020-11-14 | Disposition: A | Discharge status: Home / Self Care | Payer: Medicare Other | Attending: Orthopedic Surgery | Admitting: Orthopedic Surgery

## 2020-11-13 ENCOUNTER — Encounter: Payer: Self-pay | Admitting: Orthopedic Surgery

## 2020-11-13 DIAGNOSIS — Z7901 Long term (current) use of anticoagulants: Secondary | ICD-10-CM | POA: Insufficient documentation

## 2020-11-13 DIAGNOSIS — E669 Obesity, unspecified: Secondary | ICD-10-CM | POA: Insufficient documentation

## 2020-11-13 DIAGNOSIS — I10 Essential (primary) hypertension: Secondary | ICD-10-CM | POA: Insufficient documentation

## 2020-11-13 DIAGNOSIS — M1612 Unilateral primary osteoarthritis, left hip: Principal | ICD-10-CM | POA: Insufficient documentation

## 2020-11-13 DIAGNOSIS — Z7984 Long term (current) use of oral hypoglycemic drugs: Secondary | ICD-10-CM | POA: Insufficient documentation

## 2020-11-13 DIAGNOSIS — E119 Type 2 diabetes mellitus without complications: Secondary | ICD-10-CM | POA: Diagnosis not present

## 2020-11-13 DIAGNOSIS — Z419 Encounter for procedure for purposes other than remedying health state, unspecified: Secondary | ICD-10-CM

## 2020-11-13 DIAGNOSIS — Z96649 Presence of unspecified artificial hip joint: Secondary | ICD-10-CM

## 2020-11-13 DIAGNOSIS — Z79899 Other long term (current) drug therapy: Secondary | ICD-10-CM | POA: Insufficient documentation

## 2020-11-13 DIAGNOSIS — Z6837 Body mass index (BMI) 37.0-37.9, adult: Secondary | ICD-10-CM | POA: Insufficient documentation

## 2020-11-13 DIAGNOSIS — M25552 Pain in left hip: Secondary | ICD-10-CM | POA: Diagnosis not present

## 2020-11-13 DIAGNOSIS — G8918 Other acute postprocedural pain: Secondary | ICD-10-CM

## 2020-11-13 DIAGNOSIS — G8929 Other chronic pain: Secondary | ICD-10-CM | POA: Insufficient documentation

## 2020-11-13 DIAGNOSIS — Z85820 Personal history of malignant melanoma of skin: Secondary | ICD-10-CM | POA: Diagnosis not present

## 2020-11-13 HISTORY — PX: TOTAL HIP ARTHROPLASTY: SHX124

## 2020-11-13 LAB — GLUCOSE, CAPILLARY
Glucose-Capillary: 112 mg/dL — ABNORMAL HIGH (ref 70–99)
Glucose-Capillary: 125 mg/dL — ABNORMAL HIGH (ref 70–99)
Glucose-Capillary: 196 mg/dL — ABNORMAL HIGH (ref 70–99)
Glucose-Capillary: 137 mg/dL — ABNORMAL HIGH (ref 70–99)

## 2020-11-13 LAB — SARS CORONAVIRUS 2 (TAT 6-24 HRS): SARS Coronavirus 2: NEGATIVE

## 2020-11-13 SURGERY — ARTHROPLASTY, HIP, TOTAL, ANTERIOR APPROACH
Anesthesia: Spinal | Site: Hip | Laterality: Left

## 2020-11-13 MED ORDER — OXYCODONE HCL 5 MG PO TABS
ORAL_TABLET | ORAL | Status: AC
  Administered 2020-11-13: 10 mg via ORAL
  Filled 2020-11-13: qty 2

## 2020-11-13 MED ORDER — FENTANYL CITRATE (PF) 100 MCG/2ML IJ SOLN
INTRAMUSCULAR | Status: AC
  Filled 2020-11-13: qty 2

## 2020-11-13 MED ORDER — LAMOTRIGINE 100 MG PO TABS
150.0000 mg | ORAL_TABLET | Freq: Two times a day (BID) | ORAL | Status: DC
  Administered 2020-11-13 – 2020-11-14 (×2): 150 mg via ORAL
  Filled 2020-11-13 (×5): qty 2

## 2020-11-13 MED ORDER — INSULIN ASPART 100 UNIT/ML ~~LOC~~ SOLN
SUBCUTANEOUS | Status: AC
  Administered 2020-11-13: 3 [IU] via SUBCUTANEOUS
  Filled 2020-11-13: qty 1

## 2020-11-13 MED ORDER — PANTOPRAZOLE SODIUM 40 MG PO TBEC
80.0000 mg | DELAYED_RELEASE_TABLET | Freq: Every day | ORAL | Status: DC
  Administered 2020-11-14: 80 mg via ORAL
  Filled 2020-11-13 (×2): qty 2

## 2020-11-13 MED ORDER — ACETAMINOPHEN 500 MG PO TABS
ORAL_TABLET | ORAL | Status: AC
  Administered 2020-11-13: 1000 mg via ORAL
  Filled 2020-11-13: qty 2

## 2020-11-13 MED ORDER — DIPHENHYDRAMINE HCL 12.5 MG/5ML PO ELIX
12.5000 mg | ORAL_SOLUTION | ORAL | Status: DC | PRN
  Filled 2020-11-13: qty 10

## 2020-11-13 MED ORDER — SODIUM CHLORIDE FLUSH 0.9 % IV SOLN
INTRAVENOUS | Status: AC
  Filled 2020-11-13: qty 40

## 2020-11-13 MED ORDER — ACETAMINOPHEN 500 MG PO TABS
1000.0000 mg | ORAL_TABLET | Freq: Four times a day (QID) | ORAL | Status: AC
  Administered 2020-11-14: 1000 mg via ORAL

## 2020-11-13 MED ORDER — MAGNESIUM CITRATE PO SOLN
1.0000 | Freq: Once | ORAL | Status: DC | PRN
  Filled 2020-11-13: qty 296

## 2020-11-13 MED ORDER — PROPOFOL 10 MG/ML IV BOLUS
INTRAVENOUS | Status: DC | PRN
  Administered 2020-11-13: 50 mg via INTRAVENOUS
  Administered 2020-11-13: 30 mg via INTRAVENOUS

## 2020-11-13 MED ORDER — PHENOL 1.4 % MT LIQD
1.0000 | OROMUCOSAL | Status: DC | PRN
  Filled 2020-11-13: qty 177

## 2020-11-13 MED ORDER — HYDROMORPHONE HCL 1 MG/ML IJ SOLN
INTRAMUSCULAR | Status: AC
  Administered 2020-11-13: 1 mg via INTRAVENOUS
  Filled 2020-11-13: qty 1

## 2020-11-13 MED ORDER — NEOMYCIN-POLYMYXIN B GU 40-200000 IR SOLN
Status: DC | PRN
  Administered 2020-11-13: 4 mL

## 2020-11-13 MED ORDER — SODIUM CHLORIDE 0.9 % IV SOLN
INTRAVENOUS | Status: DC | PRN
  Administered 2020-11-13: 50 ug/min via INTRAVENOUS

## 2020-11-13 MED ORDER — OXYCODONE HCL 5 MG PO TABS
10.0000 mg | ORAL_TABLET | ORAL | Status: DC | PRN
  Administered 2020-11-13: 5 mg via ORAL

## 2020-11-13 MED ORDER — METHOCARBAMOL 1000 MG/10ML IJ SOLN
500.0000 mg | Freq: Four times a day (QID) | INTRAVENOUS | Status: DC | PRN
  Administered 2020-11-13: 500 mg via INTRAVENOUS
  Filled 2020-11-13 (×2): qty 5

## 2020-11-13 MED ORDER — VITAMIN D3 25 MCG (1000 UNIT) PO TABS
1000.0000 [IU] | ORAL_TABLET | Freq: Every day | ORAL | Status: DC
  Administered 2020-11-13: 1000 [IU] via ORAL
  Filled 2020-11-13 (×2): qty 1

## 2020-11-13 MED ORDER — METHOCARBAMOL 500 MG PO TABS: 500.0000 mg | ORAL_TABLET | Freq: Four times a day (QID) | ORAL | Status: DC | PRN

## 2020-11-13 MED ORDER — PREDNISOLONE ACETATE 1 % OP SUSP
1.0000 [drp] | Freq: Every day | OPHTHALMIC | Status: DC
  Administered 2020-11-14: 1 [drp] via OPHTHALMIC
  Filled 2020-11-13: qty 1

## 2020-11-13 MED ORDER — ONDANSETRON HCL 4 MG/2ML IJ SOLN
INTRAMUSCULAR | Status: AC
  Filled 2020-11-13: qty 2

## 2020-11-13 MED ORDER — METFORMIN HCL 850 MG PO TABS
850.0000 mg | ORAL_TABLET | Freq: Every day | ORAL | Status: DC
Start: 2020-11-14 — End: 2020-11-14
  Administered 2020-11-14: 850 mg via ORAL
  Filled 2020-11-13 (×2): qty 1

## 2020-11-13 MED ORDER — ACETAMINOPHEN 10 MG/ML IV SOLN
INTRAVENOUS | Status: DC | PRN
  Administered 2020-11-13: 1000 mg via INTRAVENOUS

## 2020-11-13 MED ORDER — ONDANSETRON HCL 4 MG/2ML IJ SOLN
INTRAMUSCULAR | Status: DC | PRN
  Administered 2020-11-13: 4 mg via INTRAVENOUS

## 2020-11-13 MED ORDER — METHOCARBAMOL 500 MG PO TABS
ORAL_TABLET | ORAL | Status: AC
  Administered 2020-11-13: 500 mg via ORAL
  Filled 2020-11-13: qty 1

## 2020-11-13 MED ORDER — ACETAMINOPHEN 10 MG/ML IV SOLN
INTRAVENOUS | Status: AC
  Filled 2020-11-13: qty 100

## 2020-11-13 MED ORDER — ARIPIPRAZOLE 10 MG PO TABS
10.0000 mg | ORAL_TABLET | Freq: Every day | ORAL | Status: DC
  Administered 2020-11-13: 10 mg via ORAL
  Filled 2020-11-13 (×2): qty 1

## 2020-11-13 MED ORDER — TRANEXAMIC ACID-NACL 1000-0.7 MG/100ML-% IV SOLN
INTRAVENOUS | Status: AC
  Administered 2020-11-13: 1000 mg via INTRAVENOUS
  Filled 2020-11-13: qty 100

## 2020-11-13 MED ORDER — HYDROMORPHONE HCL 1 MG/ML IJ SOLN
0.5000 mg | INTRAMUSCULAR | Status: DC | PRN
Start: 2020-11-13 — End: 2020-11-14

## 2020-11-13 MED ORDER — APIXABAN 5 MG PO TABS
5.0000 mg | ORAL_TABLET | Freq: Two times a day (BID) | ORAL | Status: DC
  Administered 2020-11-13 – 2020-11-14 (×2): 5 mg via ORAL
  Filled 2020-11-13 (×4): qty 1

## 2020-11-13 MED ORDER — METOCLOPRAMIDE HCL 5 MG/ML IJ SOLN: 5.0000 mg | Freq: Three times a day (TID) | INTRAMUSCULAR | Status: DC | PRN

## 2020-11-13 MED ORDER — BUPIVACAINE LIPOSOME 1.3 % IJ SUSP
INTRAMUSCULAR | Status: AC
  Filled 2020-11-13: qty 20

## 2020-11-13 MED ORDER — PROPOFOL 500 MG/50ML IV EMUL
INTRAVENOUS | Status: AC
  Filled 2020-11-13: qty 50

## 2020-11-13 MED ORDER — INSULIN ASPART 100 UNIT/ML ~~LOC~~ SOLN: 0.0000 [IU] | Freq: Three times a day (TID) | SUBCUTANEOUS | Status: DC

## 2020-11-13 MED ORDER — EPHEDRINE 5 MG/ML INJ
INTRAVENOUS | Status: AC
  Filled 2020-11-13: qty 10

## 2020-11-13 MED ORDER — OXYCODONE HCL 5 MG PO TABS
ORAL_TABLET | ORAL | Status: AC
  Administered 2020-11-13: 15 mg via ORAL
  Filled 2020-11-13: qty 3

## 2020-11-13 MED ORDER — TRIAMTERENE-HCTZ 37.5-25 MG PO TABS
2.0000 | ORAL_TABLET | Freq: Every day | ORAL | Status: DC
  Administered 2020-11-13 – 2020-11-14 (×2): 2 via ORAL
  Filled 2020-11-13 (×2): qty 2

## 2020-11-13 MED ORDER — GLYCOPYRROLATE 0.2 MG/ML IJ SOLN
INTRAMUSCULAR | Status: DC | PRN
  Administered 2020-11-13: .2 mg via INTRAVENOUS

## 2020-11-13 MED ORDER — ALUM & MAG HYDROXIDE-SIMETH 200-200-20 MG/5ML PO SUSP: 30.0000 mL | ORAL | Status: DC | PRN

## 2020-11-13 MED ORDER — TRAZODONE HCL 50 MG PO TABS
250.0000 mg | ORAL_TABLET | Freq: Every day | ORAL | Status: DC
  Administered 2020-11-13: 250 mg via ORAL
  Filled 2020-11-13 (×2): qty 1

## 2020-11-13 MED ORDER — FENTANYL CITRATE (PF) 100 MCG/2ML IJ SOLN
25.0000 ug | INTRAMUSCULAR | Status: DC | PRN
  Administered 2020-11-13: 25 ug via INTRAVENOUS
  Administered 2020-11-13: 50 ug via INTRAVENOUS
  Administered 2020-11-13: 25 ug via INTRAVENOUS

## 2020-11-13 MED ORDER — OXYCODONE HCL 5 MG PO TABS
ORAL_TABLET | ORAL | Status: AC
  Filled 2020-11-13: qty 1

## 2020-11-13 MED ORDER — DOCUSATE SODIUM 100 MG PO CAPS
100.0000 mg | ORAL_CAPSULE | Freq: Two times a day (BID) | ORAL | Status: DC
  Administered 2020-11-13 – 2020-11-14 (×2): 100 mg via ORAL
  Filled 2020-11-13 (×4): qty 1

## 2020-11-13 MED ORDER — LIDOCAINE HCL (PF) 2 % IJ SOLN
INTRAMUSCULAR | Status: AC
  Filled 2020-11-13: qty 5

## 2020-11-13 MED ORDER — FENTANYL CITRATE (PF) 100 MCG/2ML IJ SOLN
INTRAMUSCULAR | Status: AC
  Administered 2020-11-13: 25 ug via INTRAVENOUS
  Filled 2020-11-13: qty 2

## 2020-11-13 MED ORDER — SODIUM CHLORIDE 0.9 % IV SOLN: INTRAVENOUS | Status: DC

## 2020-11-13 MED ORDER — DEXTROSE 5 % IV SOLN
3.0000 g | Freq: Four times a day (QID) | INTRAVENOUS | Status: AC
  Administered 2020-11-13 (×2): 3 g via INTRAVENOUS
  Filled 2020-11-13: qty 3
  Filled 2020-11-13 (×2): qty 3000

## 2020-11-13 MED ORDER — AMLODIPINE BESYLATE 10 MG PO TABS
10.0000 mg | ORAL_TABLET | Freq: Every day | ORAL | Status: DC
  Administered 2020-11-14: 10 mg via ORAL
  Filled 2020-11-13 (×2): qty 1

## 2020-11-13 MED ORDER — ONDANSETRON HCL 4 MG PO TABS: 4.0000 mg | ORAL_TABLET | Freq: Four times a day (QID) | ORAL | Status: DC | PRN

## 2020-11-13 MED ORDER — TRIAMTERENE-HCTZ 75-50 MG PO TABS
1.0000 | ORAL_TABLET | Freq: Every day | ORAL | Status: DC
  Filled 2020-11-13: qty 1

## 2020-11-13 MED ORDER — PROPOFOL 500 MG/50ML IV EMUL
INTRAVENOUS | Status: DC | PRN
  Administered 2020-11-13: 75 ug/kg/min via INTRAVENOUS

## 2020-11-13 MED ORDER — BISACODYL 10 MG RE SUPP
10.0000 mg | Freq: Every day | RECTAL | Status: DC | PRN
  Filled 2020-11-13: qty 1

## 2020-11-13 MED ORDER — ZOLPIDEM TARTRATE 5 MG PO TABS: 5.0000 mg | ORAL_TABLET | Freq: Every evening | ORAL | Status: DC | PRN

## 2020-11-13 MED ORDER — ACETAMINOPHEN 325 MG PO TABS: 325.0000 mg | ORAL_TABLET | Freq: Four times a day (QID) | ORAL | Status: DC | PRN

## 2020-11-13 MED ORDER — ONDANSETRON HCL 4 MG/2ML IJ SOLN: 4.0000 mg | Freq: Once | INTRAMUSCULAR | Status: DC | PRN

## 2020-11-13 MED ORDER — OXYCODONE HCL 5 MG PO TABS: 5.0000 mg | ORAL_TABLET | ORAL | Status: DC | PRN

## 2020-11-13 MED ORDER — EPHEDRINE SULFATE 50 MG/ML IJ SOLN
INTRAMUSCULAR | Status: DC | PRN
  Administered 2020-11-13: 5 mg via INTRAVENOUS
  Administered 2020-11-13 (×2): 10 mg via INTRAVENOUS
  Administered 2020-11-13: 5 mg via INTRAVENOUS

## 2020-11-13 MED ORDER — ADULT MULTIVITAMIN W/MINERALS CH
1.0000 | ORAL_TABLET | Freq: Every day | ORAL | Status: DC
  Administered 2020-11-13 – 2020-11-14 (×2): 1 via ORAL
  Filled 2020-11-13 (×2): qty 1

## 2020-11-13 MED ORDER — METOCLOPRAMIDE HCL 10 MG PO TABS: 5.0000 mg | ORAL_TABLET | Freq: Three times a day (TID) | ORAL | Status: DC | PRN

## 2020-11-13 MED ORDER — TRANEXAMIC ACID-NACL 1000-0.7 MG/100ML-% IV SOLN: 1000.0000 mg | Freq: Once | INTRAVENOUS | Status: AC

## 2020-11-13 MED ORDER — PHENYLEPHRINE HCL (PRESSORS) 10 MG/ML IV SOLN
INTRAVENOUS | Status: AC
  Filled 2020-11-13: qty 1

## 2020-11-13 MED ORDER — MAGNESIUM HYDROXIDE 400 MG/5ML PO SUSP: 30.0000 mL | Freq: Every day | ORAL | Status: DC | PRN

## 2020-11-13 MED ORDER — LIDOCAINE HCL (CARDIAC) PF 100 MG/5ML IV SOSY
PREFILLED_SYRINGE | INTRAVENOUS | Status: DC | PRN
  Administered 2020-11-13: 50 mg via INTRAVENOUS

## 2020-11-13 MED ORDER — BUPIVACAINE HCL (PF) 0.5 % IJ SOLN
INTRAMUSCULAR | Status: DC | PRN
  Administered 2020-11-13: 3 mL via INTRATHECAL

## 2020-11-13 MED ORDER — MENTHOL 3 MG MT LOZG
1.0000 | LOZENGE | OROMUCOSAL | Status: DC | PRN
  Filled 2020-11-13: qty 9

## 2020-11-13 MED ORDER — NEOMYCIN-POLYMYXIN B GU 40-200000 IR SOLN
Status: AC
  Filled 2020-11-13: qty 4

## 2020-11-13 MED ORDER — PHENYLEPHRINE HCL (PRESSORS) 10 MG/ML IV SOLN
INTRAVENOUS | Status: DC | PRN
  Administered 2020-11-13: 100 ug via INTRAVENOUS

## 2020-11-13 MED ORDER — PROPOFOL 10 MG/ML IV BOLUS
INTRAVENOUS | Status: AC
  Filled 2020-11-13: qty 60

## 2020-11-13 MED ORDER — ONDANSETRON HCL 4 MG/2ML IJ SOLN: 4.0000 mg | Freq: Four times a day (QID) | INTRAMUSCULAR | Status: DC | PRN

## 2020-11-13 MED ORDER — BUPIVACAINE-EPINEPHRINE (PF) 0.25% -1:200000 IJ SOLN
INTRAMUSCULAR | Status: AC
  Filled 2020-11-13: qty 30

## 2020-11-13 MED ORDER — CHLORHEXIDINE GLUCONATE 0.12 % MT SOLN
OROMUCOSAL | Status: AC
  Administered 2020-11-13: 15 mL via OROMUCOSAL
  Filled 2020-11-13: qty 15

## 2020-11-13 SURGICAL SUPPLY — 62 items
BLADE SAGITTAL AGGR TOOTH XLG (BLADE) ×2 IMPLANT
BNDG COHESIVE 6X5 TAN STRL LF (GAUZE/BANDAGES/DRESSINGS) ×6 IMPLANT
CANISTER SUCT 1200ML W/VALVE (MISCELLANEOUS) ×2 IMPLANT
CANISTER WOUND CARE 500ML ATS (WOUND CARE) ×2 IMPLANT
CHLORAPREP W/TINT 26 (MISCELLANEOUS) ×2 IMPLANT
COVER BACK TABLE REUSABLE LG (DRAPES) ×2 IMPLANT
COVER WAND RF STERILE (DRAPES) ×2 IMPLANT
DRAPE 3/4 80X56 (DRAPES) ×6 IMPLANT
DRAPE C-ARM XRAY 36X54 (DRAPES) ×2 IMPLANT
DRAPE INCISE IOBAN 66X60 STRL (DRAPES) ×2 IMPLANT
DRAPE POUCH INSTRU U-SHP 10X18 (DRAPES) ×2 IMPLANT
DRESSING SURGICEL FIBRLLR 1X2 (HEMOSTASIS) ×2 IMPLANT
DRSG MEPILEX SACRM 8.7X9.8 (GAUZE/BANDAGES/DRESSINGS) ×2 IMPLANT
DRSG OPSITE POSTOP 4X8 (GAUZE/BANDAGES/DRESSINGS) ×4 IMPLANT
DRSG SURGICEL FIBRILLAR 1X2 (HEMOSTASIS) ×4
ELECT BLADE 6.5 EXT (BLADE) ×2 IMPLANT
ELECT REM PT RETURN 9FT ADLT (ELECTROSURGICAL) ×2
ELECTRODE REM PT RTRN 9FT ADLT (ELECTROSURGICAL) ×1 IMPLANT
GLOVE SURG SYN 9.0  PF PI (GLOVE) ×2
GLOVE SURG SYN 9.0 PF PI (GLOVE) ×2 IMPLANT
GLOVE SURG UNDER POLY LF SZ9 (GLOVE) ×2 IMPLANT
GOWN SRG 2XL LVL 4 RGLN SLV (GOWNS) ×1 IMPLANT
GOWN STRL NON-REIN 2XL LVL4 (GOWNS) ×1
GOWN STRL REUS W/ TWL LRG LVL3 (GOWN DISPOSABLE) ×1 IMPLANT
GOWN STRL REUS W/TWL LRG LVL3 (GOWN DISPOSABLE) ×1
HEMOVAC 400CC 10FR (MISCELLANEOUS) IMPLANT
HIP FEM HD M 28 (Head) ×2 IMPLANT
HOLDER FOLEY CATH W/STRAP (MISCELLANEOUS) ×2 IMPLANT
HOOD PEEL AWAY FLYTE STAYCOOL (MISCELLANEOUS) ×2 IMPLANT
IRRIGATION SURGIPHOR STRL (IV SOLUTION) IMPLANT
KIT PREVENA INCISION MGT 13 (CANNISTER) ×2 IMPLANT
LINER DML 28MM HIGHCROSS (Liner) ×2 IMPLANT
MANIFOLD NEPTUNE II (INSTRUMENTS) ×2 IMPLANT
MAT ABSORB  FLUID 56X50 GRAY (MISCELLANEOUS) ×1
MAT ABSORB FLUID 56X50 GRAY (MISCELLANEOUS) ×1 IMPLANT
NDL SAFETY ECLIPSE 18X1.5 (NEEDLE) ×1 IMPLANT
NEEDLE HYPO 18GX1.5 SHARP (NEEDLE) ×1
NEEDLE SPNL 20GX3.5 QUINCKE YW (NEEDLE) ×4 IMPLANT
NS IRRIG 1000ML POUR BTL (IV SOLUTION) ×2 IMPLANT
PACK HIP COMPR (MISCELLANEOUS) ×2 IMPLANT
SCALPEL PROTECTED #10 DISP (BLADE) ×4 IMPLANT
SHELL ACETABULAR DM  60MM (Shell) ×2 IMPLANT
SOL PREP PVP 2OZ (MISCELLANEOUS) ×2
SOLUTION PREP PVP 2OZ (MISCELLANEOUS) ×1 IMPLANT
SPONGE DRAIN TRACH 4X4 STRL 2S (GAUZE/BANDAGES/DRESSINGS) ×2 IMPLANT
STAPLER SKIN PROX 35W (STAPLE) ×2 IMPLANT
STEM FEMORAL SZ8 STD COLLARED (Stem) ×2 IMPLANT
STRAP SAFETY 5IN WIDE (MISCELLANEOUS) ×2 IMPLANT
SUT DVC 2 QUILL PDO  T11 36X36 (SUTURE) ×1
SUT DVC 2 QUILL PDO T11 36X36 (SUTURE) ×1 IMPLANT
SUT SILK 0 (SUTURE) ×1
SUT SILK 0 30XBRD TIE 6 (SUTURE) ×1 IMPLANT
SUT V-LOC 90 ABS DVC 3-0 CL (SUTURE) ×2 IMPLANT
SUT VIC AB 1 CT1 36 (SUTURE) ×2 IMPLANT
SYR 20ML LL LF (SYRINGE) ×2 IMPLANT
SYR 30ML LL (SYRINGE) ×2 IMPLANT
SYR 50ML LL SCALE MARK (SYRINGE) ×4 IMPLANT
SYR BULB IRRIG 60ML STRL (SYRINGE) ×2 IMPLANT
TAPE MICROFOAM 4IN (TAPE) ×2 IMPLANT
TOWEL OR 17X26 4PK STRL BLUE (TOWEL DISPOSABLE) ×2 IMPLANT
TRAY FOLEY MTR SLVR 16FR STAT (SET/KITS/TRAYS/PACK) ×2 IMPLANT
WAND WEREWOLF FASTSEAL 6.0 (MISCELLANEOUS) ×2 IMPLANT

## 2020-11-13 NOTE — Anesthesia Procedure Notes (Signed)
Spinal  Patient location during procedure: OR Start time: 11/13/2020 7:20 AM End time: 11/13/2020 7:33 AM Staffing Performed: resident/CRNA  Anesthesiologist: Emmie Niemann, MD Resident/CRNA: Lowry Bowl, CRNA Preanesthetic Checklist Completed: patient identified, IV checked, site marked, risks and benefits discussed, surgical consent, monitors and equipment checked, pre-op evaluation and timeout performed Spinal Block Patient position: sitting Prep: DuraPrep Patient monitoring: heart rate, cardiac monitor, continuous pulse ox and blood pressure Approach: midline Location: L3-4 Injection technique: single-shot Needle Needle type: Sprotte  Needle gauge: 24 G Needle length: 9 cm Assessment Sensory level: T4

## 2020-11-13 NOTE — H&P (Signed)
Patient presents with  . Pre-op Exam  Left Total Hip   Larry Cobb is a 75 y.o. male who presents today for evaluation of history and physical for left total hip arthroplasty with Dr. Hessie Knows on 11/13/2020. He describes groin lateral hip and thigh pain that is worse with walking and any type of activity involving hip flexion. Symptoms been present for at least 9 months. Symptoms have been increasing over the last few months. He is tried physical therapy and has not had any relief. Initially this was thought to be coming from his back. He has had 2 injections into the left hip joint most recent on 06/20/2020 with no relief. The first injection did give him great relief for several hours but then the pain returned. He describes a deep aching pain throughout the groin lateral hip and thigh. He has been ambulating with a cane with some improvement. He takes occasional ibuprofen with little relief. He denies any history of blood clots. He is a type II diabetic, last A1c was 6.3. He has having limitations in activities of daily living and quality of life due to his left hip pain.   Past Medical History: Past Medical History:  Diagnosis Date  . Diabetes mellitus type 2, uncomplicated (CMS-HCC)  s  . Fuchs' corneal dystrophy  . Hypercholesteremia  . Hypertension  . Neuropathy  bilateral arms and legs  . PTSD (post-traumatic stress disorder)   Past Surgical History: Past Surgical History:  Procedure Laterality Date  . APPENDECTOMY  . cardiac ablation  . colon surgery  . COLONOSCOPY 05/25/2020  Tubular adenoma/PHx CP/Repeat 64yrs/TKT  . CORNEAL EYE SURGERY Right 10/21/2016  DMEK KERATOPLASTY-T OD by Dr. Doyce Para  . EXTRACTION CATARACT EXTRACAPSULAR W/INSERTION INTRAOCULAR PROSTHESIS Right 10/21/2016  DMEK KERATOPLASTY-T OD by Dr. Doyce Para  . EXTRACTION CATARACT EXTRACAPSULAR W/INSERTION INTRAOCULAR PROSTHESIS Right 10/21/2016  Procedure: EXTRACTION CATARACT EXTRACAPSULAR W/INSERTION  INTRAOCULAR PROSTHESIS; Surgeon: Doyce Para, MD; Location: [facility]; Service: Ophthalmology; Laterality: Right;  . EYE TRAUMA  h/o foreign body (piece of metal in eye)  . hernia surgery  . LENS EYE SURGERY  . neck surgery  shrapnel removed  . shoulder surgery  . TONSILLECTOMY   Past Family History: Family History  Problem Relation Age of Onset  . Vision loss Mother  diabetes  . Diabetes type II Mother  . Glaucoma Neg Hx  . Macular degeneration Neg Hx  . Anesthesia problems Neg Hx   Medications: Current Outpatient Medications Ordered in Epic  Medication Sig Dispense Refill  . amLODIPine (NORVASC) 10 MG tablet Take 10 mg by mouth every morning.   Marland Kitchen apixaban (ELIQUIS) 5 mg tablet Take 1 tablet (5 mg total) by mouth every 12 (twelve) hours 60 tablet 11  . ARIPiprazole (ABILIFY) 10 MG tablet Take 10 mg by mouth nightly  . cholecalciferol (CHOLECALCIFEROL) 1,000 unit tablet Take by mouth.  . lamoTRIgine (LAMICTAL) 150 MG tablet Take 150 mg by mouth 2 (two) times daily.  . metFORMIN (GLUCOPHAGE) 850 MG tablet Take 850 mg by mouth daily with breakfast  . MULTIVITAMIN ORAL Take by mouth.  Marland Kitchen omeprazole (PRILOSEC) 20 MG DR capsule Take 20 mg by mouth nightly.   . traZODone (DESYREL) 100 MG tablet Take 200 mg by mouth nightly.   . triamterene-hydrochlorothiazide (MAXZIDE) 75-50 mg tablet Take 1 tablet by mouth once daily.   No current Epic-ordered facility-administered medications on file.   Allergies: Allergies  Allergen Reactions  . Indocin [Indomethacin] Other (See Comments)  "I bleed  through my skin"    Review of Systems:  A comprehensive 14 point ROS was performed, reviewed by me today, and the pertinent orthopaedic findings are documented in the HPI.  Exam: BP 118/82  Wt (!) 134.8 kg (297 lb 3.2 oz)  BMI 38.16 kg/m  General:  Well developed, well nourished, no apparent distress, normal affect, antalgic gait with a cane.  HEENT: Head normocephalic, atraumatic,  PERRL.   Abdomen: Soft, non tender, non distended, Bowel sounds present.  Heart: Examination of the heart reveals regular, rate, and rhythm. There is no murmur noted on ascultation. There is a normal apical pulse.  Lungs: Lungs are clear to auscultation. There is no wheeze, rhonchi, or crackles. There is normal expansion of bilateral chest walls.   Lumbar Spine: Examination of the lumbar spine reveals no bony abnormality, no edema, and no ecchymosis. There is no step off. The patient has full range of motion of the lumbar spine with flexion and extension. The patient has normal lateral bend and rotation. The patient has no pain with range of motion activities. The patient is non tender along the spinous process. The patient is non tender along the paravertebral muscles, with no muscle spasms. The patient is non tender along the iliac crest. The patient is non tender in the sciatic notch. The patient is non tender along the Sacroiliac joint. There is no Coccyx joint tenderness.   Left Lower Extremities: Examination of the left lower extremities reveals no bony abnormality, no edema, and no ecchymosis. The patient has limited range of motion of the left hip reproducing groin thigh and lateral hip pain. The patient has no anterior hip joint tenderness. The patient has a negative axial load test at the hip joint. The patient has no tenderness going from hip flexion into extension. There is severe discomfort left hip internal rotation. The patient is non tender along the greater trochanter region. The patient has a negative Bevelyn Buckles' test bilaterally. There is normal skin warmth. There is normal capillary refill bilaterally.   Neurologic: The patient has a negative straight leg raise. The patient has normal muscle strength testing for the quadriceps, calves, ankle dorsiflexion, ankle plantarflexion, and extensor hallicus longus. The patient has sensation that is intact to light touch. The deep tendon  reflexes are normal at the patella and achilles. No clonus is noted.   AP pelvis and lateral view of the left reviewed by me today from October 2021. Impression: Patient has severe left hip osteoarthritis with subchondral cyst formation of the superior acetabulum with severe sclerotic changes noted. There is no evidence of acute bony abnormality. Patient has spurring along the inferior acetabulum. Pelvic rings are intact. Mild to moderate arthritic changes throughout the right hip  Impression: Primary osteoarthritis of left hip [M16.12] Primary osteoarthritis of left hip (primary encounter diagnosis) Severe obesity (BMI 35.0-39.9) with comorbidity (CMS-HCC)  Plan:  69. 75 year old male with severe left hip pain with limitations of activities of daily living. X-ray shows severe degenerative changes throughout the left hip joint. He is having hard time ambulating. Risks, benefits, complications of a left total hip arthroplasty have been discussed with the patient. Patient has agreed and consented the procedure with Dr. Hessie Knows on 11/13/2020. This note was generated in part with voice recognition software and I apologize for any typographical errors that were not detected and corrected.  Feliberto Gottron MPA-C    Electronically signed by Feliberto Gottron, PA at 11/08/2020 4:46 PM EST    Reviewed  H+P. No changes noted.

## 2020-11-13 NOTE — Evaluation (Signed)
Physical Therapy Evaluation Patient Details Name: Larry Cobb MRN: [id number] DOB: 05-11-1946 Today's Date: 11/13/2020   History of Present Illness  75 y/o male s/p L total hip (anterior approach) 11/13/20.  Clinical Impression  Pt initially showing some hesitancy with most acts, but quickly acclimates to doing activity and showed good effort with most acts.  He was able to do expected LE exercises with L LE, nearly able to do SLRs after warm up, but needing AAROM to elevate.  Pt similarly started out during ambulation slow and guarded, but did have increased confidence, speed and consistency with increased distance.      Follow Up Recommendations Home health PT;Follow surgeon's recommendation for DC plan and follow-up therapies    Equipment Recommendations  3in1 (PT) (pt has a walker)    Recommendations for Other Services       Precautions / Restrictions Precautions Precautions: Anterior Hip;Fall Restrictions Weight Bearing Restrictions: Yes LLE Weight Bearing: Partial weight bearing LLE Partial Weight Bearing Percentage or Pounds: 50      Mobility  Bed Mobility Overal bed mobility: Modified Independent             General bed mobility comments: Pt needed heavy UE use and some extra time but ultimately did well getting up to sitting EOB    Transfers Overall transfer level: Needs assistance Equipment used: Rolling walker (2 wheeled) Transfers: Sit to/from Stand Sit to Stand: Min guard         General transfer comment: Pt needed extra cuing/time and slightly elevated (~2'") but was able to rise w/o direct assist  Ambulation/Gait Ambulation/Gait assistance: Min guard Gait Distance (Feet): 50 Feet Assistive device: Rolling walker (2 wheeled)       General Gait Details: Pt initially with slow, guarded effort but did increase confidence and (safe) speed with increased distance  Stairs            Wheelchair Mobility    Modified Rankin (Stroke  Patients Only)       Balance Overall balance assessment: Modified Independent                                           Pertinent Vitals/Pain Pain Assessment: 0-10 Pain Score: 6  Pain Location: L hip    Home Living Family/patient expects to be discharged to:: Private residence Living Arrangements: Spouse/significant other Available Help at Discharge: Family;Available 24 hours/day   Home Access: Stairs to enter Entrance Stairs-Rails: None Entrance Stairs-Number of Steps: 1 Home Layout: Able to live on main level with bedroom/bathroom;Two level Home Equipment: Walker - 2 wheels      Prior Function Level of Independence: Independent with assistive device(s)         Comments: Pt has been needing cane for all mobility recently, generally had been able to relatively active prior to hip getting worse     Hand Dominance        Extremity/Trunk Assessment   Upper Extremity Assessment Upper Extremity Assessment: Generalized weakness;Overall WFL for tasks assessed    Lower Extremity Assessment Lower Extremity Assessment:  (expected L LE post-op weakness)       Communication   Communication: No difficulties  Cognition Arousal/Alertness: Awake/alert Behavior During Therapy: WFL for tasks assessed/performed Overall Cognitive Status: Within Functional Limits for tasks assessed  General Comments      Exercises Total Joint Exercises Ankle Circles/Pumps: Strengthening;10 reps Quad Sets: Strengthening;5 reps Short Arc Quad: Strengthening;10 reps Heel Slides: AROM;5 reps (with resisted leg ext) Hip ABduction/ADduction: Strengthening;5 reps Straight Leg Raises:  (unable)   Assessment/Plan    PT Assessment Patient needs continued PT services  PT Problem List Decreased strength;Decreased range of motion;Decreased activity tolerance;Decreased balance;Decreased mobility;Decreased safety  awareness;Decreased knowledge of use of DME;Pain       PT Treatment Interventions DME instruction;Gait training;Stair training;Functional mobility training;Therapeutic activities;Therapeutic exercise;Balance training;Neuromuscular re-education;Patient/family education    PT Goals (Current goals can be found in the Care Plan section)  Acute Rehab PT Goals Patient Stated Goal: go home PT Goal Formulation: With patient Time For Goal Achievement: 11/27/20 Potential to Achieve Goals: Good    Frequency BID   Barriers to discharge        Co-evaluation               AM-PAC PT "6 Clicks" Mobility  Outcome Measure Help needed turning from your back to your side while in a flat bed without using bedrails?: A Little Help needed moving from lying on your back to sitting on the side of a flat bed without using bedrails?: A Little Help needed moving to and from a bed to a chair (including a wheelchair)?: A Little Help needed standing up from a chair using your arms (e.g., wheelchair or bedside chair)?: A Little Help needed to walk in hospital room?: A Little Help needed climbing 3-5 steps with a railing? : A Little 6 Click Score: 18    End of Session Equipment Utilized During Treatment: Gait belt Activity Tolerance: Patient limited by pain;Patient tolerated treatment well Patient left: with call bell/phone within reach;with family/visitor present;in chair Nurse Communication: Mobility status PT Visit Diagnosis: Muscle weakness (generalized) (M62.81);Difficulty in walking, not elsewhere classified (R26.2);Pain Pain - Right/Left: Left Pain - part of body: Hip    Time: 1400-1445 PT Time Calculation (min) (ACUTE ONLY): 45 min   Charges:   PT Evaluation $PT Eval Low Complexity: 1 Low PT Treatments $Gait Training: 8-22 mins $Therapeutic Exercise: 8-22 mins        Kreg Shropshire, DPT 11/13/2020, 4:53 PM

## 2020-11-13 NOTE — Transfer of Care (Signed)
Immediate Anesthesia Transfer of Care Note  Patient: Larry Cobb  Procedure(s) Performed: TOTAL HIP ARTHROPLASTY ANTERIOR APPROACH (Left Hip)  Patient Location: PACU  Anesthesia Type:Spinal  Level of Consciousness: awake, drowsy and patient cooperative  Airway & Oxygen Therapy: Patient Spontanous Breathing and Patient connected to face mask oxygen  Post-op Assessment: Report given to RN and Post -op Vital signs reviewed and stable  Post vital signs: Reviewed and stable  Last Vitals:  Vitals Value Taken Time  BP 102/62 11/13/20 0938  Temp    Pulse 102 11/13/20 0942  Resp 17 11/13/20 0942  SpO2 100 % 11/13/20 0942  Vitals shown include unvalidated device data.  Last Pain:  Vitals:   11/13/20 0620  PainSc: 8          Complications: No complications documented.

## 2020-11-13 NOTE — Op Note (Signed)
11/13/2020  9:48 AM  PATIENT:  Larry Cobb  75 y.o. male  PRE-OPERATIVE DIAGNOSIS:  Chronic hip pain, left M25.552, G89.29 Primary osteoarthritis of left hip M16.12  POST-OPERATIVE DIAGNOSIS:  Chronic hip pain, left M25.552, G89.29  PROCEDURE:  Procedure(s): TOTAL HIP ARTHROPLASTY ANTERIOR APPROACH (Left)  SURGEON: Laurene Footman, MD  ASSISTANTS: None  ANESTHESIA:   spinal  EBL:  Total I/O In: 1550 [I.V.:1400; IV Piggyback:150] Out: 1000 [Urine:600; Blood:400]  BLOOD ADMINISTERED:none  DRAINS: Incisional wound VAC   LOCAL MEDICATIONS USED:  MARCAINE    and OTHER Exparel  SPECIMEN:  Source of Specimen:  Left femoral head  DISPOSITION OF SPECIMEN:  PATHOLOGY  COUNTS:  YES  TOURNIQUET:  * No tourniquets in log *  IMPLANTS: Medacta AMIS 8 standard stem, 60 mm Mpact DM cup and liner with metal 28 mm M head  DICTATION: .Dragon Dictation   The patient was brought to the operating room and after spinal anesthesia was obtained patient was placed on the operative table with the ipsilateral foot into the Medacta attachment, contralateral leg on a well-padded table. C-arm was brought in and preop template x-ray taken. After prepping and draping in usual sterile fashion appropriate patient identification and timeout procedures were completed. Anterior approach to the hip was obtained and centered over the greater trochanter and TFL muscle. The subcutaneous tissue was incised hemostasis being achieved by electrocautery. TFL fascia was incised and the muscle retracted laterally deep retractor placed. The lateral femoral circumflex vessels were identified and ligated. The anterior capsule was exposed and a capsulotomy performed. The neck was identified and a femoral neck cut carried out with a saw. The head was removed without difficulty and showed sclerotic femoral head and acetabulum. Reaming was carried out to 58 mm and a 60 mm cup trial gave appropriate tightness to the acetabular  component a 60 mm DM cup was impacted into position. The leg was then externally rotated and ischiofemoral and pubofemoral releases carried out. The femur was sequentially broached to a size 8, size 8 standard with S head trials were placed and the final components chosen. The 8 standard stem was inserted along with a metal M 28 mm head and 60 mm liner. The hip was reduced and was stable the wound was thoroughly irrigated with fibrillar placed along the posterior capsule and medial neck. The deep fascia ws closed using a heavy Quill after infiltration of 30 cc of quarter percent Sensorcaine with epinephrine diluted with Exparel throughout the case.  Marland Kitchen3-0 V-loc to close the skin with skin staples.  Incisional wound VAC applied and patient was sent to recovery in stable condition.   PLAN OF CARE: Admit to inpatient

## 2020-11-13 NOTE — Anesthesia Preprocedure Evaluation (Signed)
Anesthesia Evaluation  Patient identified by MRN, date of birth, ID band Patient awake    Reviewed: Allergy & Precautions, NPO status , Patient's Chart, lab work & pertinent test results  History of Anesthesia Complications Negative for: history of anesthetic complications  Airway Mallampati: II  TM Distance: >3 FB Neck ROM: Full    Dental  (+) Poor Dentition   Pulmonary neg sleep apnea, neg COPD, former smoker,    breath sounds clear to auscultation- rhonchi (-) wheezing      Cardiovascular hypertension, Pt. on medications (-) CAD, (-) Past MI, (-) Cardiac Stents and (-) CABG + dysrhythmias Atrial Fibrillation  Rhythm:Regular Rate:Normal - Systolic murmurs and - Diastolic murmurs    Neuro/Psych neg Seizures PSYCHIATRIC DISORDERS Anxiety negative neurological ROS     GI/Hepatic Neg liver ROS, GERD  ,  Endo/Other  diabetes, Oral Hypoglycemic Agents  Renal/GU negative Renal ROS     Musculoskeletal negative musculoskeletal ROS (+)   Abdominal (+) + obese,   Peds  Hematology negative hematology ROS (+)   Anesthesia Other Findings Past Medical History: No date: A-fib (HCC) No date: Diabetes mellitus without complication (HCC) No date: Fuchs' corneal dystrophy No date: GERD (gastroesophageal reflux disease) No date: Hypercholesteremia No date: Hypertension No date: Melanoma of skin (HCC)     Comment:  Resected from his nose in 1980's No date: Neuropathy No date: Pneumonia No date: PTSD (post-traumatic stress disorder)   Reproductive/Obstetrics                             Anesthesia Physical Anesthesia Plan  ASA: III  Anesthesia Plan: Spinal   Post-op Pain Management:    Induction:   PONV Risk Score and Plan: 1 and Propofol infusion  Airway Management Planned: Natural Airway  Additional Equipment:   Intra-op Plan:   Post-operative Plan:   Informed Consent: I have reviewed  the patients History and Physical, chart, labs and discussed the procedure including the risks, benefits and alternatives for the proposed anesthesia with the patient or authorized representative who has indicated his/her understanding and acceptance.     Dental advisory given  Plan Discussed with: CRNA and Anesthesiologist  Anesthesia Plan Comments: (>72 hours from last dose of apixaban)        Anesthesia Quick Evaluation

## 2020-11-14 ENCOUNTER — Encounter: Payer: Self-pay | Admitting: Orthopedic Surgery

## 2020-11-14 DIAGNOSIS — M1612 Unilateral primary osteoarthritis, left hip: Secondary | ICD-10-CM | POA: Diagnosis not present

## 2020-11-14 LAB — BASIC METABOLIC PANEL
Anion gap: 7 (ref 5–15)
BUN: 15 mg/dL (ref 8–23)
CO2: 23 mmol/L (ref 22–32)
Calcium: 8 mg/dL — ABNORMAL LOW (ref 8.9–10.3)
Chloride: 102 mmol/L (ref 98–111)
Creatinine, Ser: 1.1 mg/dL (ref 0.61–1.24)
GFR, Estimated: >60 mL/min (ref >60)
Glucose, Bld: 151 mg/dL — ABNORMAL HIGH (ref 70–99)
Potassium: 3.6 mmol/L (ref 3.5–5.1)
Sodium: 132 mmol/L — ABNORMAL LOW (ref 135–145)

## 2020-11-14 LAB — CBC
HCT: 34.4 % — ABNORMAL LOW (ref 39.0–52.0)
Hemoglobin: 12.2 g/dL — ABNORMAL LOW (ref 13.0–17.0)
MCH: 31.9 pg (ref 26.0–34.0)
MCHC: 35.5 g/dL (ref 30.0–36.0)
MCV: 89.8 fL (ref 80.0–100.0)
Platelets: 149 10*3/uL — ABNORMAL LOW (ref 150–400)
RBC: 3.83 MIL/uL — ABNORMAL LOW (ref 4.22–5.81)
RDW: 12.5 % (ref 11.5–15.5)
WBC: 9 10*3/uL (ref 4.0–10.5)
nRBC: 0 % (ref 0.0–0.2)

## 2020-11-14 LAB — GLUCOSE, CAPILLARY
Glucose-Capillary: 120 mg/dL — ABNORMAL HIGH (ref 70–99)
Glucose-Capillary: 180 mg/dL — ABNORMAL HIGH (ref 70–99)

## 2020-11-14 LAB — SURGICAL PATHOLOGY

## 2020-11-14 MED ORDER — OXYCODONE HCL 5 MG PO TABS
ORAL_TABLET | ORAL | Status: AC
  Administered 2020-11-14: 15 mg via ORAL
  Filled 2020-11-14: qty 3

## 2020-11-14 MED ORDER — ACETAMINOPHEN 325 MG PO TABS
ORAL_TABLET | ORAL | Status: AC
  Administered 2020-11-14: 650 mg via ORAL
  Filled 2020-11-14: qty 2

## 2020-11-14 MED ORDER — OXYCODONE HCL 10 MG PO TABS: 10.0000 mg | ORAL_TABLET | ORAL | 0 refills | Status: DC | PRN

## 2020-11-14 MED ORDER — INSULIN ASPART 100 UNIT/ML ~~LOC~~ SOLN
SUBCUTANEOUS | Status: AC
  Administered 2020-11-14: 3 [IU] via SUBCUTANEOUS
  Filled 2020-11-14: qty 1

## 2020-11-14 MED ORDER — DOCUSATE SODIUM 100 MG PO CAPS: 100.0000 mg | ORAL_CAPSULE | Freq: Two times a day (BID) | ORAL | 0 refills | Status: DC

## 2020-11-14 MED ORDER — METHOCARBAMOL 500 MG PO TABS: 500.0000 mg | ORAL_TABLET | Freq: Four times a day (QID) | ORAL | 0 refills | Status: DC | PRN

## 2020-11-14 MED ORDER — MAGNESIUM HYDROXIDE 400 MG/5ML PO SUSP: 30.0000 mL | Freq: Once | ORAL | Status: DC

## 2020-11-14 MED ORDER — METHOCARBAMOL 500 MG PO TABS
ORAL_TABLET | ORAL | Status: AC
  Administered 2020-11-14: 500 mg via ORAL
  Filled 2020-11-14: qty 1

## 2020-11-14 MED ORDER — ACETAMINOPHEN 500 MG PO TABS
ORAL_TABLET | ORAL | Status: AC
  Administered 2020-11-14: 1000 mg via ORAL
  Filled 2020-11-14: qty 2

## 2020-11-14 MED ORDER — ACETAMINOPHEN 500 MG PO TABS
ORAL_TABLET | ORAL | Status: AC
  Filled 2020-11-14: qty 2

## 2020-11-14 NOTE — Progress Notes (Signed)
   Subjective: 1 Day Post-Op Procedure(s) (LRB): TOTAL HIP ARTHROPLASTY ANTERIOR APPROACH (Left) Patient reports pain as 7 on 0-10 scale.   Patient is well, and has had no acute complaints or problems Denies any CP, SOB, ABD pain. We will continue therapy today.  Plan is to go Home after hospital stay.  Objective: Vital signs in last 24 hours: Temp:  [97 F (36.1 C)-99.3 F (37.4 C)] 99.3 F (37.4 C) (03/02 0400) Pulse Rate:  [65-103] 76 (03/02 0410) Resp:  [0-17] 16 (03/02 0400) BP: (98-137)/(60-83) 119/69 (03/02 0400) SpO2:  [90 %-99 %] 93 % (03/02 0410) Weight:  [131.5 kg] 131.5 kg (03/01 1335)  Intake/Output from previous day: 03/01 0701 - 03/02 0700 In: 3330.4 [P.O.:240; I.V.:2852.1; IV Piggyback:238.3] Out: 2100 [Urine:1700; Blood:400] Intake/Output this shift: No intake/output data recorded.  Recent Labs    11/14/20 0300  HGB 12.2*   Recent Labs    11/14/20 0300  WBC 9.0  RBC 3.83*  HCT 34.4*  PLT 149*   Recent Labs    11/14/20 0300  NA 132*  K 3.6  CL 102  CO2 23  BUN 15  CREATININE 1.10  GLUCOSE 151*  CALCIUM 8.0*   No results for input(s): LABPT, INR in the last 72 hours.  EXAM General - Patient is Alert, Appropriate and Oriented Extremity - Neurovascular intact Sensation intact distally Intact pulses distally Dorsiflexion/Plantar flexion intact No cellulitis present Compartment soft Dressing - dressing C/D/I and no drainage, prevena intact with out drainage Motor Function - intact, moving foot and toes well on exam.   Past Medical History:  Diagnosis Date  . A-fib ([place])   . Diabetes mellitus without complication ([place])   . Fuchs' corneal dystrophy   . GERD (gastroesophageal reflux disease)   . Hypercholesteremia   . Hypertension   . Melanoma of skin ([place])    Resected from his nose in 1980's  . Neuropathy   . Pneumonia   . PTSD (post-traumatic stress disorder)     Assessment/Plan:   1 Day Post-Op Procedure(s) (LRB): TOTAL  HIP ARTHROPLASTY ANTERIOR APPROACH (Left) Active Problems:   S/P hip replacement  Estimated body mass index is 37.22 kg/m as calculated from the following:   Height as of this encounter: 6\' 2"  (1.88 m).   Weight as of this encounter: 131.5 kg. Advance diet Up with therapy  Work on [employer] and vitals stable CM to assist with discharge to home with HHPT  DVT Prophylaxis - Lovenox, TED hose and SCDS Weight-Bearing as tolerated to left leg   T. Rachelle Hora, PA-C [facility] 11/14/2020, 8:18 AM

## 2020-11-14 NOTE — Discharge Summary (Signed)
Physician Discharge Summary  Patient ID: Larry Cobb MRN: [id number] DOB/AGE: 11/27/1945 75 y.o.  Admit date: 11/13/2020 Discharge date: 11/14/2020  Admission Diagnoses:  S/P hip replacement [Z96.649]   Discharge Diagnoses: Patient Active Problem List   Diagnosis Date Noted  . S/P hip replacement 11/13/2020  . Foot infection 09/09/2017    Past Medical History:  Diagnosis Date  . A-fib ([place])   . Diabetes mellitus without complication ([place])   . Fuchs' corneal dystrophy   . GERD (gastroesophageal reflux disease)   . Hypercholesteremia   . Hypertension   . Melanoma of skin ([place])    Resected from his nose in 1980's  . Neuropathy   . Pneumonia   . PTSD (post-traumatic stress disorder)      Transfusion: none   Consultants (if any):   Discharged Condition: Improved  Hospital Course: Larry Cobb is an 75 y.o. male who was admitted 11/13/2020 with a diagnosis of left hip osteoathritis and went to the operating room on 11/13/2020 and underwent the above named procedures.    Surgeries: Procedure(s): TOTAL HIP ARTHROPLASTY ANTERIOR APPROACH on 11/13/2020 Patient tolerated the surgery well. Taken to PACU where she was stabilized and then transferred to the orthopedic floor.  Started on Eliquis. Foot pumps applied bilaterally at 80 mm. Heels elevated on bed with rolled towels. No evidence of DVT. Negative [place]. Physical therapy started on day #1 for gait training and transfer. OT started day #1 for ADL and assisted devices.  Patient's foley was d/c on day #1. Patient's IV was d/c on day #1.  On post op day #1 patient was stable and ready for discharge to home with HHPT.  Implants: Medacta AMIS 8 standard stem, 60 mm Mpact DM cup and liner with metal 28 mm M head  He was given perioperative antibiotics:  Anti-infectives (From admission, onward)   Start     Dose/Rate Route Frequency Ordered Stop   11/13/20 1400  ceFAZolin (ANCEF) 3 g in dextrose 5 % 50 mL IVPB        3  g 100 mL/hr over 30 Minutes Intravenous Every 6 hours 11/13/20 1028 11/13/20 2032   11/13/20 0600  ceFAZolin (ANCEF) 3 g in dextrose 5 % 50 mL IVPB        3 g 100 mL/hr over 30 Minutes Intravenous On call to O.R. 11/12/20 2326 11/13/20 0745   11/13/20 0600  ceFAZolin (ANCEF) 3 g in dextrose 5 % 50 mL IVPB  Status:  Discontinued        3 g 100 mL/hr over 30 Minutes Intravenous On call to O.R. 11/12/20 2327 11/13/20 1332    .  He was given sequential compression devices, early ambulation, and Eliquis TEDs for DVT prophylaxis.  He benefited maximally from the hospital stay and there were no complications.    Recent vital signs:  Vitals:   11/14/20 0909 11/14/20 1149  BP: 134/67 121/70  Pulse: 79 (!) 104  Resp: 14 14  Temp: 100 F (37.8 C) 97.7 F (36.5 C)  SpO2: 91% 93%    Recent laboratory studies:  Lab Results  Component Value Date   HGB 12.2 (L) 11/14/2020   HGB 15.3 11/08/2020   HGB 14.6 09/19/2020   Lab Results  Component Value Date   WBC 9.0 11/14/2020   PLT 149 (L) 11/14/2020   No results found for: INR Lab Results  Component Value Date   NA 132 (L) 11/14/2020   K 3.6 11/14/2020   CL 102 11/14/2020  CO2 23 11/14/2020   BUN 15 11/14/2020   CREATININE 1.10 11/14/2020   GLUCOSE 151 (H) 11/14/2020    Discharge Medications:   Allergies as of 11/14/2020      Reactions   Indocin [indomethacin] Other (See Comments)   "I bleed through my skin"      Medication List    TAKE these medications   amLODipine 10 MG tablet Commonly known as: NORVASC Take 10 mg by mouth daily.   apixaban 5 MG Tabs tablet Commonly known as: ELIQUIS Take 5 mg by mouth 2 (two) times daily.   ARIPiprazole 10 MG tablet Commonly known as: ABILIFY Take 10 mg by mouth at bedtime.   Cholecalciferol 25 MCG (1000 UT) tablet Take 1,000 Units by mouth daily.   docusate sodium 100 MG capsule Commonly known as: COLACE Take 1 capsule (100 mg total) by mouth 2 (two) times daily.    lamoTRIgine 150 MG tablet Commonly known as: LAMICTAL Take 1 tablet (150 mg total) by mouth 2 (two) times daily.   metFORMIN 850 MG tablet Commonly known as: GLUCOPHAGE Take 1 tablet (850 mg total) by mouth 2 (two) times daily with a meal. What changed: when to take this   methocarbamol 500 MG tablet Commonly known as: ROBAXIN Take 1 tablet (500 mg total) by mouth every 6 (six) hours as needed for muscle spasms.   multivitamin with minerals tablet Take 1 tablet by mouth daily.   omeprazole 20 MG capsule Commonly known as: PRILOSEC Take 20 mg by mouth every evening.   Oxycodone HCl 10 MG Tabs Take 1-1.5 tablets (10-15 mg total) by mouth every 4 (four) hours as needed for severe pain (pain score 7-10).   prednisoLONE acetate 1 % ophthalmic suspension Commonly known as: PRED FORTE Place 1 drop into the right eye daily.   traZODone 100 MG tablet Commonly known as: DESYREL Take 250 mg by mouth at bedtime.   triamterene-hydrochlorothiazide 75-50 MG tablet Commonly known as: MAXZIDE Take 1 tablet by mouth daily.            Durable Medical Equipment  (From admission, onward)         Start     Ordered   11/13/20 1335  DME Walker rolling  Once       Question Answer Comment  Walker: With [place]   Patient needs a walker to treat with the following condition S/P hip replacement      11/13/20 1335   11/13/20 1335  DME 3 n 1  Once        11/13/20 1335   11/13/20 1335  DME Bedside commode  Once       Question:  Patient needs a bedside commode to treat with the following condition  Answer:  S/P hip replacement   11/13/20 1335          Diagnostic Studies: DG HIP OPERATIVE UNILAT W OR W/O PELVIS LEFT  Result Date: 11/13/2020 CLINICAL DATA:  Pre and post images EXAM: OPERATIVE LEFT HIP (WITH PELVIS IF PERFORMED) 2 VIEWS TECHNIQUE: Fluoroscopic spot image(s) were submitted for interpretation post-operatively. COMPARISON:  None. FINDINGS: Fluoro time: 18 seconds. Two  C-arm fluoroscopic images were obtained intraoperatively and submitted for post operative interpretation. The first images is pre operative. The second image demonstrates left total hip arthroplasty. No unexpected findings. Please see the performing provider's procedural report for further detail. IMPRESSION: Left total hip arthroplasty. Electronically Signed   By: Margaretha Sheffield MD   On: 11/13/2020 11:26  DG HIP UNILAT W OR W/O PELVIS 2-3 VIEWS LEFT  Result Date: 11/13/2020 CLINICAL DATA:  Status post left total hip arthroplasty. EXAM: DG HIP (WITH OR WITHOUT PELVIS) 2-3V LEFT COMPARISON:  None. FINDINGS: The left acetabular and femoral components are well situated. Expected postoperative changes are seen in the surrounding soft tissues. IMPRESSION: Status post left total hip arthroplasty. Electronically Signed   By: Marijo Conception M.D.   On: 11/13/2020 10:59    Disposition: Discharge disposition: 06-Home-Health Care Svc          Follow-up Information    Duanne Guess, PA-C Follow up in 2 week(s).   Specialties: Orthopedic Surgery, Emergency Medicine Contact information: [place] [address] [phone]                Signed: Feliberto Gottron 11/14/2020, 4:18 PM

## 2020-11-14 NOTE — Progress Notes (Signed)
Physical Therapy Treatment Patient Details Name: Larry Cobb MRN: [id number] DOB: Feb 19, 1946 Today's Date: 11/14/2020    History of Present Illness 75 y/o male s/p L total hip (anterior approach) 11/13/20.    PT Comments    Pt continues to be slow and labored with mobility and transfers, but spent much of the time brushing up on technique and sequencing.  Wife and patient educated again on setting up the home, getting in/out of car, use of ADs and continued HEP.    Follow Up Recommendations  Home health PT;Follow surgeon's recommendation for DC plan and follow-up therapies     Equipment Recommendations  3in1 (PT)    Recommendations for Other Services       Precautions / Restrictions Precautions Precautions: Anterior Hip;Fall Precaution Booklet Issued: Yes (comment) Restrictions LLE Weight Bearing: Partial weight bearing    Mobility  Bed Mobility Overal bed mobility: Needs Assistance Bed Mobility: Supine to Sit     Supine to sit: Min assist     General bed mobility comments: Pt with slow effort getting to EOB, using rails to rise.  He did need light assist to fully get/maintain upright torso    Transfers Overall transfer level: Needs assistance Equipment used: Rolling walker (2 wheeled) Transfers: Sit to/from Stand Sit to Stand: Min guard         General transfer comment: unable to rise on first 2 attempts at low bed height, 2-3" elevated and able to rise.  4 x sit to stand from recliner with cues/clarification of posture/set up/sequencing  Ambulation/Gait Ambulation/Gait assistance: Min guard Gait Distance (Feet): 125 Feet Assistive device: Rolling walker (2 wheeled)       General Gait Details: Per his usual; slow to start but increasing speed, cadence, confidence with increased time/distance   Stairs             Wheelchair Mobility    Modified Rankin (Stroke Patients Only)       Balance Overall balance assessment: Modified  Independent                                          Cognition Arousal/Alertness: Awake/alert Behavior During Therapy: WFL for tasks assessed/performed Overall Cognitive Status: Within Functional Limits for tasks assessed                                        Exercises Total Joint Exercises Short Arc Quad: Strengthening;AROM;15 reps Heel Slides: AROM;10 reps;Strengthening Hip ABduction/ADduction: Strengthening;10 reps    General Comments        Pertinent Vitals/Pain Pain Score: 4     Home Living                      Prior Function            PT Goals (current goals can now be found in the care plan section) Progress towards PT goals: Progressing toward goals    Frequency    BID      PT Plan Current plan remains appropriate    Co-evaluation              AM-PAC PT "6 Clicks" Mobility   Outcome Measure  Help needed turning from your back to your side while in a flat bed without using bedrails?: A  Little Help needed moving from lying on your back to sitting on the side of a flat bed without using bedrails?: A Little Help needed moving to and from a bed to a chair (including a wheelchair)?: A Little Help needed standing up from a chair using your arms (e.g., wheelchair or bedside chair)?: A Little Help needed to walk in hospital room?: A Little Help needed climbing 3-5 steps with a railing? : A Little 6 Click Score: 18    End of Session Equipment Utilized During Treatment: Gait belt Activity Tolerance: Patient limited by pain;Patient tolerated treatment well Patient left: with call bell/phone within reach;with family/visitor present;in chair Nurse Communication: Mobility status PT Visit Diagnosis: Muscle weakness (generalized) (M62.81);Difficulty in walking, not elsewhere classified (R26.2);Pain Pain - Right/Left: Left Pain - part of body: Hip     Time: 9480-1655 PT Time Calculation (min) (ACUTE ONLY): 32  min  Charges:  $Gait Training: 8-22 mins $Therapeutic Activity: 8-22 mins                     Kreg Shropshire, DPT 11/14/2020, 5:43 PM

## 2020-11-14 NOTE — Anesthesia Postprocedure Evaluation (Signed)
Anesthesia Post Note  Patient: Larry Cobb  Procedure(s) Performed: TOTAL HIP ARTHROPLASTY ANTERIOR APPROACH (Left Hip)  Patient location during evaluation: Nursing Unit Anesthesia Type: Spinal Level of consciousness: oriented and awake and alert Pain management: pain level controlled Vital Signs Assessment: post-procedure vital signs reviewed and stable Respiratory status: spontaneous breathing and respiratory function stable Cardiovascular status: blood pressure returned to baseline and stable Postop Assessment: no headache, no backache and no apparent nausea or vomiting Anesthetic complications: no   No complications documented.   Last Vitals:  Vitals:   11/14/20 0400 11/14/20 0410  BP: 119/69   Pulse: 71 76  Resp: 16   Temp: 37.4 C   SpO2: 90% 93%    Last Pain:  Vitals:   11/14/20 0504  TempSrc:   PainSc: 5                  Ivyonna Hoelzel [address]

## 2020-11-14 NOTE — Evaluation (Signed)
Occupational Therapy Evaluation Patient Details Name: Larry Cobb MRN: [id number] DOB: [birth year] Today's Date: 11/14/2020    History of Present Illness 75 y/o male s/p L total hip (anterior approach) 11/13/20.   Clinical Impression   Patient presenting with decreased I in self care, balance, functional mobility/transfer, endurance, and safety awareness.  Patient reports living at home with wife PTA. Pt does endorse needing assistance with LB self care prior to surgery secondary to pain.  Patient currently functioning at min guard with use of RW. OT reviewed and provided edu handout for self care retraining for safety and comfort. Pt verbalizing understanding and having already made these modification prior to surgery secondary to pain. Pt feels confident about returning home with wife to assist as needed.  Patient will benefit from acute OT to increase overall independence in the areas of ADLs, functional mobility, and safety awareness in order to safely discharge home with wife.    Follow Up Recommendations  No OT follow up;Supervision - Intermittent    Equipment Recommendations  None recommended by OT;Other (comment) (Pt has all needed equipment)       Precautions / Restrictions Precautions Precautions: Anterior Hip;Fall Restrictions Weight Bearing Restrictions: Yes LLE Weight Bearing: Partial weight bearing LLE Partial Weight Bearing Percentage or Pounds: 50      Mobility Bed Mobility               General bed mobility comments: Pt seated in recliner chair    Transfers Overall transfer level: Needs assistance Equipment used: Rolling walker (2 wheeled) Transfers: Sit to/from Stand Sit to Stand: Min guard         General transfer comment: min cuing for hand placement and technique        ADL either performed or assessed with clinical judgement   ADL Overall ADL's : Needs assistance/impaired     Grooming: Wash/dry hands;Wash/dry face;Oral  care;Sitting;Modified independent               Lower Body Dressing: Minimal assistance Lower Body Dressing Details (indicate cue type and reason): socks                     Vision Baseline Vision/History: No visual deficits Patient Visual Report: No change from baseline              Pertinent Vitals/Pain Pain Assessment: 0-10 Pain Score: 8  Pain Location: L hip Pain Descriptors / Indicators: Aching;Discomfort;Guarding Pain Intervention(s): Limited activity within patient's tolerance;Monitored during session;Premedicated before session     Hand Dominance Right   Extremity/Trunk Assessment Upper Extremity Assessment Upper Extremity Assessment: Generalized weakness;Overall [facility] for tasks assessed   Lower Extremity Assessment Lower Extremity Assessment: Defer to PT evaluation       Communication Communication Communication: No difficulties   Cognition Arousal/Alertness: Awake/alert Behavior During Therapy: WFL for tasks assessed/performed Overall Cognitive Status: Within Functional Limits for tasks assessed                                                Home Living Family/patient expects to be discharged to:: Private residence Living Arrangements: Spouse/significant other Available Help at Discharge: Family;Available 24 hours/day   Home Access: Stairs to enter Entrance Stairs-Number of Steps: 1 Entrance Stairs-Rails: None Home Layout: Able to live on main level with bedroom/bathroom;Two level  Home Equipment: [place] - 2 wheels          Prior Functioning/Environment Level of Independence: Independent with assistive device(s)        Comments: Pt has been needing cane for all mobility recently, generally had been able to relatively active prior to hip getting worse        OT Problem List: Decreased strength;Impaired balance (sitting and/or standing);Decreased knowledge of precautions;Pain;Decreased activity  tolerance;Decreased knowledge of use of DME or AE;Decreased safety awareness      OT Treatment/Interventions: Self-care/ADL training;Manual therapy;Therapeutic exercise;Energy conservation;DME and/or AE instruction;Cognitive remediation/compensation;Therapeutic activities;Neuromuscular education;Balance training;Patient/family education    OT Goals(Current goals can be found in the care plan section) Acute Rehab OT Goals Patient Stated Goal: go home OT Goal Formulation: With patient Time For Goal Achievement: 11/28/20 Potential to Achieve Goals: Good ADL Goals Pt Will Perform Grooming: standing;with modified independence Pt Will Perform Lower Body Dressing: with supervision;with adaptive equipment;sit to/from stand Pt Will Transfer to Toilet: with modified independence;ambulating Pt Will Perform Toileting - Clothing Manipulation and hygiene: with modified independence;sit to/from stand  OT Frequency: Min 2X/week   Barriers to D/C:    none known at this time          AM-PAC OT "6 Clicks" Daily Activity     Outcome Measure Help from another person eating meals?: None Help from another person taking care of personal grooming?: A Little Help from another person toileting, which includes using toliet, bedpan, or urinal?: A Little Help from another person bathing (including washing, rinsing, drying)?: A Little Help from another person to put on and taking off regular upper body clothing?: None Help from another person to put on and taking off regular lower body clothing?: A Little 6 Click Score: 20   End of Session    Activity Tolerance: Patient tolerated treatment well Patient left: in chair;with call bell/phone within reach  OT Visit Diagnosis: Muscle weakness (generalized) (M62.81);Pain Pain - Right/Left: Left Pain - part of body: Hip                Time: 7939-0300 OT Time Calculation (min): 14 min Charges:  OT General Charges $OT Visit: 1 Visit OT Evaluation $OT Eval Low  Complexity: 1 Low  Darleen Crocker, MS, OTR/L , CBIS ascom [phone]  11/14/20, 11:55 AM

## 2020-11-14 NOTE — Progress Notes (Signed)
Nsg Discharge Note  Admit Date:  11/13/2020 Discharge date: 11/14/2020   MANLY NESTLE to be D/C'd Home per MD order.  AVS completed.  Patient/caregiver able to verbalize understanding.  Discharge Medication: Allergies as of 11/14/2020      Reactions   Indocin [indomethacin] Other (See Comments)   "I bleed through my skin"      Medication List    TAKE these medications   amLODipine 10 MG tablet Commonly known as: NORVASC Take 10 mg by mouth daily.   apixaban 5 MG Tabs tablet Commonly known as: ELIQUIS Take 5 mg by mouth 2 (two) times daily.   ARIPiprazole 10 MG tablet Commonly known as: ABILIFY Take 10 mg by mouth at bedtime.   Cholecalciferol 25 MCG (1000 UT) tablet Take 1,000 Units by mouth daily.   docusate sodium 100 MG capsule Commonly known as: COLACE Take 1 capsule (100 mg total) by mouth 2 (two) times daily.   lamoTRIgine 150 MG tablet Commonly known as: LAMICTAL Take 1 tablet (150 mg total) by mouth 2 (two) times daily.   metFORMIN 850 MG tablet Commonly known as: GLUCOPHAGE Take 1 tablet (850 mg total) by mouth 2 (two) times daily with a meal. What changed: when to take this   methocarbamol 500 MG tablet Commonly known as: ROBAXIN Take 1 tablet (500 mg total) by mouth every 6 (six) hours as needed for muscle spasms.   multivitamin with minerals tablet Take 1 tablet by mouth daily.   omeprazole 20 MG capsule Commonly known as: PRILOSEC Take 20 mg by mouth every evening.   Oxycodone HCl 10 MG Tabs Take 1-1.5 tablets (10-15 mg total) by mouth every 4 (four) hours as needed for severe pain (pain score 7-10).   prednisoLONE acetate 1 % ophthalmic suspension Commonly known as: PRED FORTE Place 1 drop into the right eye daily.   traZODone 100 MG tablet Commonly known as: DESYREL Take 250 mg by mouth at bedtime.   triamterene-hydrochlorothiazide 75-50 MG tablet Commonly known as: MAXZIDE Take 1 tablet by mouth daily.            Durable  Medical Equipment  (From admission, onward)         Start     Ordered   11/13/20 1335  DME Walker rolling  Once       Question Answer Comment  Walker: With 5 Inch Wheels   Patient needs a walker to treat with the following condition S/P hip replacement      11/13/20 1335   11/13/20 1335  DME 3 n 1  Once        11/13/20 1335   11/13/20 1335  DME Bedside commode  Once       Question:  Patient needs a bedside commode to treat with the following condition  Answer:  S/P hip replacement   11/13/20 1335          Discharge Assessment: Vitals:   11/14/20 1149 11/14/20 1638  BP: 121/70 120/66  Pulse: (!) 104 (!) 109  Resp: 14 14  Temp: 97.7 F (36.5 C) 98.3 F (36.8 C)  SpO2: 93% 91%   Skin clean, dry and intact without evidence of skin break down, no evidence of skin tears noted. IV catheter discontinued intact. Site without signs and symptoms of complications - no redness or edema noted at insertion site, patient denies c/o pain - only slight tenderness at site.  Dressing with slight pressure applied.  D/c Instructions-Education: Discharge instructions given to patient/family  with verbalized understanding. D/c education completed with patient/family including follow up instructions, medication list, d/c activities limitations if indicated, with other d/c instructions as indicated by MD - patient able to verbalize understanding, all questions fully answered. Patient instructed to return to ED, call 911, or call MD for any changes in condition.  Patient escorted via [place], and D/C home via private auto.  Tresa Endo, RN 11/14/2020 5:04 PM

## 2020-11-14 NOTE — Progress Notes (Signed)
Physical Therapy Treatment Patient Details Name: Larry Cobb MRN: [id number] DOB: 1946/06/05 Today's Date: 11/14/2020    History of Present Illness 75 y/o male s/p L total hip (anterior approach) 11/13/20.    PT Comments    Pt did well with PT session this AM.  Showed good effort despite continued pain and was able to ambulate ~75 ft, negotiate up/down steps and showing increased tolerance and confidence with LE exercises.  He is eager to go home and apart from needing some increased time and extra effort was able to do mobility tasks w/o direct assist.     Follow Up Recommendations  Home health PT;Follow surgeon's recommendation for DC plan and follow-up therapies     Equipment Recommendations  3in1 (PT)    Recommendations for Other Services       Precautions / Restrictions Precautions Precautions: Anterior Hip;Fall Precaution Booklet Issued: Yes (comment) Restrictions Weight Bearing Restrictions: Yes LLE Weight Bearing: Partial weight bearing LLE Partial Weight Bearing Percentage or Pounds: 50    Mobility  Bed Mobility Overal bed mobility: Modified Independent             General bed mobility comments: Pt slow and labored with the effort, reliant on rail but able to rise to sitting w/o assist    Transfers Overall transfer level: Needs assistance Equipment used: Rolling walker (2 wheeled) Transfers: Sit to/from Stand Sit to Stand: Min guard         General transfer comment: Pt again needing some extra cuing for appropriate set up (U&LEs, precautions, etc) but with use of rails was able to rise w/o direct assist.  Ambulation/Gait Ambulation/Gait assistance: Min guard Gait Distance (Feet): 75 Feet Assistive device: Rolling walker (2 wheeled)       General Gait Details: Pt did well with ambulation, similar to yesterday he started out slow and labored, but with increased distance showed increased confidence, speed and cadence.  Cues to insure maintaining  PWBing, but safe and with increased consistency with increased distance.   Stairs Stairs: Yes Stairs assistance: Modified independent (Device/Increase time) Stair Management: Two rails Number of Stairs: 4 General stair comments: Pt able to negotiate up/down 4 steps with b/l rails.  Able to use appropriate sequencing after minimal inital cuing.   Wheelchair Mobility    Modified Rankin (Stroke Patients Only)       Balance Overall balance assessment: Modified Independent                                          Cognition Arousal/Alertness: Awake/alert Behavior During Therapy: WFL for tasks assessed/performed Overall Cognitive Status: Within Functional Limits for tasks assessed                                        Exercises Total Joint Exercises Ankle Circles/Pumps: Strengthening;10 reps Quad Sets: Strengthening;10 reps Short Arc Quad: Strengthening;AROM;15 reps Heel Slides: AROM;10 reps;Strengthening (with resisted leg ext) Hip ABduction/ADduction: Strengthening;10 reps    General Comments        Pertinent Vitals/Pain Pain Assessment: 0-10 Pain Score: 6  Pain Location: L hip Pain Descriptors / Indicators: Aching;Discomfort;Guarding Pain Intervention(s): Limited activity within patient's tolerance;Monitored during session;Premedicated before session    Home Living Family/patient expects to be discharged to:: Private residence Living Arrangements: Spouse/significant other Available Help at  Discharge: Family;Available 24 hours/day   Home Access: Stairs to enter Entrance Stairs-Rails: None Home Layout: Able to live on main level with bedroom/bathroom;Two level Home Equipment: Walker - 2 wheels      Prior Function Level of Independence: Independent with assistive device(s)      Comments: Pt has been needing cane for all mobility recently, generally had been able to relatively active prior to hip getting worse   PT Goals  (current goals can now be found in the care plan section) Acute Rehab PT Goals Patient Stated Goal: go home Progress towards PT goals: Progressing toward goals    Frequency    BID      PT Plan Current plan remains appropriate    Co-evaluation              AM-PAC PT "6 Clicks" Mobility   Outcome Measure  Help needed turning from your back to your side while in a flat bed without using bedrails?: A Little Help needed moving from lying on your back to sitting on the side of a flat bed without using bedrails?: A Little Help needed moving to and from a bed to a chair (including a wheelchair)?: A Little Help needed standing up from a chair using your arms (e.g., wheelchair or bedside chair)?: A Little Help needed to walk in hospital room?: A Little Help needed climbing 3-5 steps with a railing? : A Little 6 Click Score: 18    End of Session Equipment Utilized During Treatment: Gait belt Activity Tolerance: Patient limited by pain;Patient tolerated treatment well Patient left: with call bell/phone within reach;with family/visitor present;in chair Nurse Communication: Mobility status PT Visit Diagnosis: Muscle weakness (generalized) (M62.81);Difficulty in walking, not elsewhere classified (R26.2);Pain Pain - Right/Left: Left Pain - part of body: Hip     Time: 2633-3545 PT Time Calculation (min) (ACUTE ONLY): 49 min  Charges:  $Gait Training: 8-22 mins $Therapeutic Exercise: 8-22 mins $Therapeutic Activity: 8-22 mins                     Kreg Shropshire, DPT 11/14/2020, 1:20 PM

## 2020-11-14 NOTE — TOC Progression Note (Signed)
Transition of Care [facility]) - Progression Note    Patient Details  Name: Larry Cobb MRN: [id number] Date of Birth: 12-04-1945  Transition of Care [facility]) CM/SW Contact  Eileen Stanford, LCSW Phone Number: 11/14/2020, 9:49 AM  Clinical Narrative:   CSW unable to speak with pt. Pt is working with PT. Recommendation previously was HH.         Expected Discharge Plan and Services                                                 Social Determinants of Health (SDOH) Interventions    Readmission Risk Interventions No flowsheet data found.

## 2020-11-14 NOTE — Discharge Instructions (Signed)

## 2020-11-14 NOTE — Care Management Obs Status (Signed)
[place] NOTIFICATION   Patient Details  Name: Larry Cobb MRN: [id number] Date of Birth: 1946/07/18   Medicare Observation Status Notification Given:  Yes    Gerrianne Scale Ashish Rossetti, LCSW 11/14/2020, 2:23 PM

## 2020-11-14 NOTE — Care Management CC44 (Signed)
Condition Code 44 Documentation Completed  Patient Details  Name: FAVIO MODER MRN: [id number] Date of Birth: 09/01/1946   Condition Code 44 given:  Yes Patient signature on Condition Code 44 notice:  Yes Documentation of 2 MD's agreement:  Yes Code 44 added to claim:  Yes    Gerrianne Scale Jeet Shough, LCSW 11/14/2020, 2:23 PM

## 2021-06-18 ENCOUNTER — Emergency Department: Payer: Medicare Other

## 2021-06-18 ENCOUNTER — Encounter: Payer: Self-pay | Admitting: Radiology

## 2021-06-18 ENCOUNTER — Inpatient Hospital Stay
Admission: EM | Admit: 2021-06-18 | Discharge: 2021-06-20 | DRG: 644 | Disposition: A | Discharge status: Home / Self Care | Payer: Medicare Other | Attending: Internal Medicine | Admitting: Internal Medicine

## 2021-06-18 ENCOUNTER — Other Ambulatory Visit: Payer: Self-pay

## 2021-06-18 DIAGNOSIS — Z20822 Contact with and (suspected) exposure to covid-19: Secondary | ICD-10-CM | POA: Diagnosis present

## 2021-06-18 DIAGNOSIS — R221 Localized swelling, mass and lump, neck: Secondary | ICD-10-CM | POA: Diagnosis not present

## 2021-06-18 DIAGNOSIS — I482 Chronic atrial fibrillation, unspecified: Secondary | ICD-10-CM | POA: Diagnosis not present

## 2021-06-18 DIAGNOSIS — N179 Acute kidney failure, unspecified: Secondary | ICD-10-CM | POA: Diagnosis present

## 2021-06-18 DIAGNOSIS — Z96642 Presence of left artificial hip joint: Secondary | ICD-10-CM | POA: Diagnosis present

## 2021-06-18 DIAGNOSIS — Z888 Allergy status to other drugs, medicaments and biological substances status: Secondary | ICD-10-CM

## 2021-06-18 DIAGNOSIS — E7800 Pure hypercholesterolemia, unspecified: Secondary | ICD-10-CM | POA: Diagnosis present

## 2021-06-18 DIAGNOSIS — E1142 Type 2 diabetes mellitus with diabetic polyneuropathy: Secondary | ICD-10-CM | POA: Diagnosis present

## 2021-06-18 DIAGNOSIS — Z89421 Acquired absence of other right toe(s): Secondary | ICD-10-CM

## 2021-06-18 DIAGNOSIS — E041 Nontoxic single thyroid nodule: Principal | ICD-10-CM | POA: Diagnosis present

## 2021-06-18 DIAGNOSIS — K219 Gastro-esophageal reflux disease without esophagitis: Secondary | ICD-10-CM | POA: Diagnosis present

## 2021-06-18 DIAGNOSIS — E0789 Other specified disorders of thyroid: Secondary | ICD-10-CM | POA: Diagnosis not present

## 2021-06-18 DIAGNOSIS — Z7901 Long term (current) use of anticoagulants: Secondary | ICD-10-CM

## 2021-06-18 DIAGNOSIS — E876 Hypokalemia: Secondary | ICD-10-CM | POA: Diagnosis present

## 2021-06-18 DIAGNOSIS — E119 Type 2 diabetes mellitus without complications: Secondary | ICD-10-CM | POA: Diagnosis not present

## 2021-06-18 DIAGNOSIS — Z96612 Presence of left artificial shoulder joint: Secondary | ICD-10-CM | POA: Diagnosis present

## 2021-06-18 DIAGNOSIS — Z79899 Other long term (current) drug therapy: Secondary | ICD-10-CM

## 2021-06-18 DIAGNOSIS — I10 Essential (primary) hypertension: Secondary | ICD-10-CM | POA: Diagnosis present

## 2021-06-18 DIAGNOSIS — Z7984 Long term (current) use of oral hypoglycemic drugs: Secondary | ICD-10-CM

## 2021-06-18 DIAGNOSIS — Z6837 Body mass index (BMI) 37.0-37.9, adult: Secondary | ICD-10-CM

## 2021-06-18 DIAGNOSIS — Z85820 Personal history of malignant melanoma of skin: Secondary | ICD-10-CM

## 2021-06-18 DIAGNOSIS — Z803 Family history of malignant neoplasm of breast: Secondary | ICD-10-CM

## 2021-06-18 DIAGNOSIS — R1310 Dysphagia, unspecified: Secondary | ICD-10-CM | POA: Diagnosis present

## 2021-06-18 DIAGNOSIS — Z833 Family history of diabetes mellitus: Secondary | ICD-10-CM

## 2021-06-18 DIAGNOSIS — F4310 Post-traumatic stress disorder, unspecified: Secondary | ICD-10-CM | POA: Diagnosis not present

## 2021-06-18 DIAGNOSIS — E1169 Type 2 diabetes mellitus with other specified complication: Secondary | ICD-10-CM

## 2021-06-18 DIAGNOSIS — E079 Disorder of thyroid, unspecified: Secondary | ICD-10-CM | POA: Diagnosis present

## 2021-06-18 LAB — CBC
HCT: 44 % (ref 39.0–52.0)
Hemoglobin: 15.9 g/dL (ref 13.0–17.0)
MCH: 32 pg (ref 26.0–34.0)
MCHC: 36.1 g/dL — ABNORMAL HIGH (ref 30.0–36.0)
MCV: 88.5 fL (ref 80.0–100.0)
Platelets: 201 10*3/uL (ref 150–400)
RBC: 4.97 MIL/uL (ref 4.22–5.81)
RDW: 12.4 % (ref 11.5–15.5)
WBC: 7.8 10*3/uL (ref 4.0–10.5)
nRBC: 0 % (ref 0.0–0.2)

## 2021-06-18 LAB — COMPREHENSIVE METABOLIC PANEL
ALT: 19 U/L (ref 0–44)
AST: 17 U/L (ref 15–41)
Albumin: 4.3 g/dL (ref 3.5–5.0)
Alkaline Phosphatase: 76 U/L (ref 38–126)
Anion gap: 9 (ref 5–15)
BUN: 22 mg/dL (ref 8–23)
CO2: 27 mmol/L (ref 22–32)
Calcium: 9.3 mg/dL (ref 8.9–10.3)
Chloride: 97 mmol/L — ABNORMAL LOW (ref 98–111)
Creatinine, Ser: 1.42 mg/dL — ABNORMAL HIGH (ref 0.61–1.24)
GFR, Estimated: 52 mL/min — ABNORMAL LOW (ref >60)
Glucose, Bld: 144 mg/dL — ABNORMAL HIGH (ref 70–99)
Potassium: 3.5 mmol/L (ref 3.5–5.1)
Sodium: 133 mmol/L — ABNORMAL LOW (ref 135–145)
Total Bilirubin: 1.8 mg/dL — ABNORMAL HIGH (ref 0.3–1.2)
Total Protein: 7.6 g/dL (ref 6.5–8.1)

## 2021-06-18 LAB — TSH: TSH: 3.239 u[IU]/mL (ref 0.350–4.500)

## 2021-06-18 LAB — RESP PANEL BY RT-PCR (FLU A&B, COVID) ARPGX2
Influenza A by PCR: NEGATIVE
Influenza B by PCR: NEGATIVE
SARS Coronavirus 2 by RT PCR: NEGATIVE

## 2021-06-18 LAB — T4, FREE: Free T4: 1.07 ng/dL (ref 0.61–1.12)

## 2021-06-18 MED ORDER — TRIAMTERENE-HCTZ 75-50 MG PO TABS
1.0000 | ORAL_TABLET | Freq: Every day | ORAL | Status: DC
  Filled 2021-06-18 (×2): qty 1

## 2021-06-18 MED ORDER — TRAMADOL HCL 50 MG PO TABS: 50.0000 mg | ORAL_TABLET | Freq: Three times a day (TID) | ORAL | Status: DC | PRN

## 2021-06-18 MED ORDER — AMLODIPINE BESYLATE 10 MG PO TABS
10.0000 mg | ORAL_TABLET | Freq: Every day | ORAL | Status: DC
  Administered 2021-06-19 – 2021-06-20 (×2): 10 mg via ORAL
  Filled 2021-06-18 (×2): qty 1

## 2021-06-18 MED ORDER — IOHEXOL 350 MG/ML SOLN
75.0000 mL | Freq: Once | INTRAVENOUS | Status: AC | PRN
  Administered 2021-06-18: 75 mL via INTRAVENOUS

## 2021-06-18 MED ORDER — SODIUM CHLORIDE 0.9% FLUSH
3.0000 mL | Freq: Two times a day (BID) | INTRAVENOUS | Status: DC
  Administered 2021-06-18 – 2021-06-20 (×3): 3 mL via INTRAVENOUS

## 2021-06-18 MED ORDER — KETOROLAC TROMETHAMINE 15 MG/ML IJ SOLN
15.0000 mg | Freq: Four times a day (QID) | INTRAMUSCULAR | Status: DC | PRN
  Filled 2021-06-18: qty 1

## 2021-06-18 MED ORDER — INSULIN ASPART 100 UNIT/ML IJ SOLN
0.0000 [IU] | Freq: Three times a day (TID) | INTRAMUSCULAR | Status: DC
  Administered 2021-06-20: 3 [IU] via SUBCUTANEOUS
  Filled 2021-06-18: qty 1

## 2021-06-18 MED ORDER — METFORMIN HCL 850 MG PO TABS: 850.0000 mg | ORAL_TABLET | Freq: Every day | ORAL | Status: DC

## 2021-06-18 MED ORDER — DOCUSATE SODIUM 100 MG PO CAPS
100.0000 mg | ORAL_CAPSULE | Freq: Two times a day (BID) | ORAL | Status: DC
  Administered 2021-06-18 – 2021-06-20 (×4): 100 mg via ORAL
  Filled 2021-06-18 (×4): qty 1

## 2021-06-18 MED ORDER — SODIUM CHLORIDE 0.9% FLUSH: 3.0000 mL | INTRAVENOUS | Status: DC | PRN

## 2021-06-18 MED ORDER — TRAZODONE HCL 50 MG PO TABS
250.0000 mg | ORAL_TABLET | Freq: Every day | ORAL | Status: DC
  Administered 2021-06-18 – 2021-06-19 (×2): 250 mg via ORAL
  Filled 2021-06-18 (×2): qty 1

## 2021-06-18 MED ORDER — SODIUM CHLORIDE 0.9 % IV SOLN: 250.0000 mL | INTRAVENOUS | Status: DC | PRN

## 2021-06-18 MED ORDER — OXYCODONE HCL 5 MG PO TABS: 10.0000 mg | ORAL_TABLET | ORAL | Status: DC | PRN

## 2021-06-18 MED ORDER — PANTOPRAZOLE SODIUM 40 MG PO TBEC
40.0000 mg | DELAYED_RELEASE_TABLET | Freq: Every day | ORAL | Status: DC
  Administered 2021-06-19 – 2021-06-20 (×2): 40 mg via ORAL
  Filled 2021-06-18 (×2): qty 1

## 2021-06-18 MED ORDER — SODIUM CHLORIDE 0.45 % IV SOLN: INTRAVENOUS | Status: DC

## 2021-06-18 MED ORDER — PREDNISOLONE ACETATE 1 % OP SUSP
1.0000 [drp] | Freq: Every day | OPHTHALMIC | Status: DC
  Administered 2021-06-19 – 2021-06-20 (×2): 1 [drp] via OPHTHALMIC
  Filled 2021-06-18: qty 1

## 2021-06-18 MED ORDER — LAMOTRIGINE 25 MG PO TABS
150.0000 mg | ORAL_TABLET | Freq: Two times a day (BID) | ORAL | Status: DC
  Administered 2021-06-18 – 2021-06-20 (×4): 150 mg via ORAL
  Filled 2021-06-18 (×4): qty 1

## 2021-06-18 MED ORDER — METHOCARBAMOL 500 MG PO TABS
500.0000 mg | ORAL_TABLET | Freq: Four times a day (QID) | ORAL | Status: DC | PRN
  Filled 2021-06-18: qty 1

## 2021-06-18 MED ORDER — ARIPIPRAZOLE 10 MG PO TABS
10.0000 mg | ORAL_TABLET | Freq: Every day | ORAL | Status: DC
  Administered 2021-06-18 – 2021-06-19 (×2): 10 mg via ORAL
  Filled 2021-06-18 (×4): qty 1

## 2021-06-18 NOTE — ED Triage Notes (Signed)
Pt states that his throat became sore yesterday, pt now has pain in the right side of his neck with a large amount of swelling noted. Pt is also c/o right ear pain, pt reports having all childhood immunizations

## 2021-06-18 NOTE — ED Notes (Signed)
Gave pt sandwich tray, okay per MD Bradler verbal.

## 2021-06-18 NOTE — H&P (Signed)
History and Physical    Larry Cobb:[id number] DOB: [birth year] DOA: 06/18/2021  PCP: Baxter Hire, MD   Patient coming from: home  I have personally briefly reviewed patient's old medical records in [place]  Chief Complaint: Sweeling right neck  HPI: SERJIO Cobb is a 75 y.o. male with medical history significant of DM w/o complications, DERD, HTN,HLD who noticed swelling of the rightlower neck thisAM. He denies any trauma, denies severe coughing. He has had no infectious symptoms: no fever, chills, purulent rhinorrhea, sore throat. The swelling did increase over the early morning. He has had some swallowing difficulty, c/o sore throat, no respiratory distress, no stridor. He presents to [facility] for evaluation.    ED Course: T97.8  160/93  HR 78  R 15. EDP exam - easily palpable mass right neck. Lab: CBC nl, Glucose 144, Cr 1.42 (baseline 1.10 11/14/20). TSH 3.29, Free T4 1.07. CTA neck - 6.5 cm right thyroid mass with some shift of esophagus but no compromise to other structures. U/S thyroid pending. Seen by Dr. Tami Ribas for ENT in the ED - no emergent intervention indicated. Recommends aspiration under imaging by IR. TRH called to admit patient for continue evaluation and treatment.   Review of Systems: As per HPI otherwise 10 point review of systems negative.    Past Medical History:  Diagnosis Date   A-fib [facility])    Diabetes mellitus without complication ([place])    Fuchs' corneal dystrophy    GERD (gastroesophageal reflux disease)    Hypercholesteremia    Hypertension    Melanoma of skin ([place])    Resected from his nose in 1980's   Neuropathy    Pneumonia    PTSD (post-traumatic stress disorder)     Past Surgical History:  Procedure Laterality Date   ABLATION  1992   cardiac   AMPUTATION TOE Right 09/11/2017   Procedure: 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;  Surgeon: [name], DPM;  Location: [facility] ORS;  Service: Podiatry;  Laterality: Right;   APPENDECTOMY   1972   CATARACT EXTRACTION Right 10/21/2016   COLON SURGERY  2006   removed a foot of colon   COLONOSCOPY  05/25/2020   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 He had a son and a daughter from earlier marriage plus an adopted son. Wife had two children. 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 Lives with his wife and two miniature labradoodles.    reports that he quit smoking about 30 years ago. His smoking use included cigarettes. He has never used smokeless tobacco. He reports that he does not drink alcohol and does not use drugs.  Allergies  Allergen Reactions   Indocin [Indomethacin] Other (See Comments)    "I bleed through my skin"    Family History  Problem Relation Age of Onset   Breast cancer Mother    Diabetes  Mother      Prior to Admission medications   Medication Sig Start Date End Date Taking? Authorizing Provider  amLODipine (NORVASC) 10 MG tablet Take 10 mg by mouth daily.    [provider]  apixaban (ELIQUIS) 5 MG TABS tablet Take 5 mg by mouth 2 (two) times daily.    [provider]  ARIPiprazole (ABILIFY) 10 MG tablet Take 10 mg by mouth at bedtime.    [provider]  Cholecalciferol 25 MCG (1000 UT) tablet Take 1,000 Units by mouth daily.    [provider]  docusate sodium (COLACE) 100 MG capsule Take 1 capsule (100 mg total) by mouth 2 (two) times daily. 11/14/20   Duanne Guess, PA-C  lamoTRIgine (LAMICTAL) 150 MG  tablet Take 1 tablet (150 mg total) by mouth 2 (two) times daily. 09/12/17   Vaughan Basta, MD  metFORMIN (GLUCOPHAGE) 850 MG tablet Take 1 tablet (850 mg total) by mouth 2 (two) times daily with a meal. Patient taking differently: Take 850 mg by mouth daily. 09/12/17   Vaughan Basta, MD  methocarbamol (ROBAXIN) 500 MG tablet Take 1 tablet (500 mg total) by mouth every 6 (six) hours as needed for muscle spasms. 11/14/20   Duanne Guess, PA-C  Multiple Vitamins-Minerals (MULTIVITAMIN WITH MINERALS) tablet Take 1 tablet by mouth daily.    [provider]  omeprazole (PRILOSEC) 20 MG capsule Take 20 mg by mouth every evening.    [provider]  oxyCODONE 10 MG TABS Take 1-1.5 tablets (10-15 mg total) by mouth every 4 (four) hours as needed for severe pain (pain score 7-10). 11/14/20   Duanne Guess, PA-C  prednisoLONE acetate (PRED FORTE) 1 % ophthalmic suspension Place 1 drop into the right eye daily.    [provider]  traZODone (DESYREL) 100 MG tablet Take 250 mg by mouth at bedtime.    [provider]  triamterene-hydrochlorothiazide (MAXZIDE) 75-50 MG tablet Take 1 tablet by mouth daily.    [provider]    Physical Exam: Vitals:   06/18/21 1638 06/18/21 1642 06/18/21 1807 06/18/21 1835  BP: 114/82  120/74 (!) 160/93  Pulse: 62  (!) 59 78  Resp: 17  16 15   Temp:   97.8 F (36.6 C)   TempSrc:   Oral   SpO2: 97% 98% 97% 95%  Weight:      Height:        Vitals:   06/18/21 1638 06/18/21 1642 06/18/21 1807 06/18/21 1835  BP: 114/82  120/74 (!) 160/93  Pulse: 62  (!) 59 78  Resp: 17  16 15   Temp:   97.8 F (36.6 C)   TempSrc:   Oral   SpO2: 97% 98% 97% 95%  Weight:      Height:       General: overweight man in no distress Eyes: PERRL, lids and conjunctivae normal ENMT: Mucous membranes are moist. Posterior pharynx clear of any exudate or lesions.Normal dentition.  Neck: obese, easily palpable mass lower anterior  right neck, not tender. Respiratory: clear to auscultation bilaterally, no wheezing, no crackles. Normal respiratory effort. No accessory muscle use.  Cardiovascular: Regular rate vs rate controlled slow a. Fib. No extremity edema. 2+ pedal pulses. No carotid bruits.  Abdomen: no tenderness, no masses palpated. No hepatosplenomegaly. Bowel sounds positive.  Musculoskeletal: no clubbing / cyanosis. No joint deformity upper and lower extremities. Good ROM, no contractures. Normal muscle tone.  Skin: no rashes, lesions, ulcers. No induration Neurologic: CN  2-12 grossly intact. Sensation intact, DTR normal. Strength 5/5 in all 4.  Psychiatric: Normal judgment and insight. Alert and oriented x 3. Normal mood.     Labs on Admission: I have personally reviewed following labs and imaging studies  CBC: Recent Labs  Lab 06/18/21 1439  WBC 7.8  HGB 15.9  HCT 44.0  MCV 88.5  PLT 626   Basic Metabolic Panel: Recent Labs  Lab 06/18/21 1439  NA 133*  K 3.5  CL 97*  CO2 27  GLUCOSE 144*  BUN 22  CREATININE 1.42*  CALCIUM 9.3   GFR: Estimated Creatinine Clearance: 65.4 mL/min (A) (by C-G formula based on SCr of 1.42 mg/dL (H)). Liver Function Tests: Recent Labs  Lab 06/18/21 1439  AST 17  ALT 19  ALKPHOS 76  BILITOT 1.8*  PROT 7.6  ALBUMIN 4.3   No results for input(s): LIPASE, AMYLASE in the last 168 hours. No results for input(s): AMMONIA in the last 168 hours. Coagulation Profile: No results for input(s): INR, PROTIME in the last 168 hours. Cardiac Enzymes: No results for input(s): CKTOTAL, CKMB, CKMBINDEX, TROPONINI in the last 168 hours. BNP (last 3 results) No results for input(s): PROBNP in the last 8760 hours. HbA1C: No results for input(s): HGBA1C in the last 72 hours. CBG: No results for input(s): GLUCAP in the last 168 hours. Lipid Profile: No results for input(s): CHOL, HDL, LDLCALC, TRIG, CHOLHDL, LDLDIRECT in the last 72 hours. Thyroid Function  Tests: Recent Labs    06/18/21 1641  TSH 3.239  FREET4 1.07   Anemia Panel: No results for input(s): VITAMINB12, FOLATE, FERRITIN, TIBC, IRON, RETICCTPCT in the last 72 hours. Urine analysis:    Component Value Date/Time   COLORURINE YELLOW (A) 11/08/2020 1204   APPEARANCEUR HAZY (A) 11/08/2020 1204   LABSPEC 1.017 11/08/2020 1204   PHURINE 6.0 11/08/2020 1204   GLUCOSEU NEGATIVE 11/08/2020 1204   HGBUR NEGATIVE 11/08/2020 1204   BILIRUBINUR NEGATIVE 11/08/2020 1204   KETONESUR NEGATIVE 11/08/2020 1204   PROTEINUR NEGATIVE 11/08/2020 1204   NITRITE NEGATIVE 11/08/2020 1204   LEUKOCYTESUR NEGATIVE 11/08/2020 1204    Radiological Exams on Admission: CT Angio Neck W and/or Wo Contrast  Result Date: 06/18/2021 CLINICAL DATA:  Stroke/TIA, assess extracranial arteries. Right neck pain and swelling, eval for aneurysm. EXAM: CT ANGIOGRAPHY NECK TECHNIQUE: Multidetector CT imaging of the neck was performed using the standard protocol during bolus administration of intravenous contrast. Multiplanar CT image reconstructions and MIPs were obtained to evaluate the vascular anatomy. Carotid stenosis measurements (when applicable) are obtained utilizing NASCET criteria, using the distal internal carotid diameter as the denominator. CONTRAST:  66mL OMNIPAQUE IOHEXOL 350 MG/ML SOLN COMPARISON:  None. FINDINGS: Aortic arch: Standard 3 vessel aortic arch with mild calcified plaque. Widely patent arch vessel origins. Right carotid system: Patent with a small amount of calcified and soft plaque at the carotid bifurcation. No evidence of a significant stenosis or dissection. Left carotid system: Patent with a small amount of calcified plaque in the proximal ICA. No evidence of a significant stenosis or dissection. Vertebral arteries: The vertebral arteries are patent with mild atherosclerotic plaque in the V3 and V4 segments bilaterally. There is no evidence of a significant stenosis or dissection. The left  vertebral artery is moderately dominant. Skeleton: Advanced disc degeneration from C4-5 to C7-T1. Advanced facet arthrosis on the right at C2-3 and on the left at C3-4 and C4-5 with facet ankylosis at the latter. Other neck: 4.9 x 5.6 x 6.5 cm (AP  x transverse x craniocaudal) hypoattenuating mass arising from the right thyroid lobe displacing the internal jugular vein laterally in the common carotid artery posteriorly. No significant mass effect on the airway. No enlarged lymph nodes in the neck. Upper chest: Clear lung apices. IMPRESSION: 1. Mild atherosclerosis without significant stenosis or aneurysm in the neck. 2. 6.5 cm right thyroid mass. Recommend thyroid US. Reference: J Am Coll Radiol. 2015 Feb;12(2): 143-50 3. Aortic Atherosclerosis (ICD10-I70.0). Electronically Signed   By: Logan Bores M.D.   On: 06/18/2021 15:48    EKG: Independently reviewed. 12 lead ordered  Assessment/Plan Active Problems:   Thyroid mass of unclear etiology   DM2 (diabetes mellitus, type 2) (HCC)   Atrial fibrillation, chronic (HCC)   PTSD (post-traumatic stress disorder)   HTN (hypertension)  Thyroid mass - most likely hemorrhagic mass in patient on NOAC. Thyroid function nl. Patient does c/o sore throat Plan Obs admit  Hold eliquis  FNA by IR  Rapid strep screen  2. A fib - patient with stable rate Plan 12 lead EKG  Hold eliquis  At discharge recommend ASA until clear resolution of bleeding  Continue cardiac meds  3. DM2 - continue metformin, ss coverage  4. HTN- mildly elevated in ED Plan Continue home meds  5. AKI - mild bump in Cr to 1.42 Plan IV hydration  F/u Bmet in AM  6. PTSD - continue home meds   DVT prophylaxis: SCDs  Code Status: full code  Family Communication: wife was present during ED and ENT evals  Disposition Plan: home when stable  Consults called: ENT - Dr Tami Ribas saw patient.  Admission status: obs    Adella Hare MD [facility] Pager [phone]   If 7PM-7AM,  please contact night-coverage [email or web address] Password [email or web address]  06/18/2021, 8:02 PM

## 2021-06-18 NOTE — ED Provider Notes (Signed)
Emergency Medicine Provider Triage Evaluation Note  Larry Cobb , a 75 y.o. male  was evaluated in triage.  Pt complains of right neck pain and swelling for 24 hrs.  Review of Systems  Positive: Sore throat Negative: numbness  Physical Exam  BP 139/74   Pulse 60   Temp 98.5 F (36.9 C) (Oral)   Resp 17   Ht 6\' 2"  (1.88 m)   Wt 133.8 kg   SpO2 97%   BMI 37.88 kg/m  Gen:   Awake, no distress   Resp:  Normal effort  MSK:   Moves extremities without difficulty  Other:  Tender swelling to right neck, pulsatile, no bruit, no overlying erythema  Medical Decision Making  Medically screening exam initiated at 2:48 PM.  Appropriate orders placed.   Have ordered CTA.  Protecting airway but notified staff of need for bed.  TARQUIN WELCHER was informed that the remainder of the evaluation will be completed by another provider, this initial triage assessment does not replace that evaluation, and the importance of remaining in the ED until their evaluation is complete.    Merlyn Lot, MD 06/18/21 [id number]

## 2021-06-18 NOTE — Consult Note (Signed)
Larry Cobb, Larry Cobb [id number] 1945-12-30 Larry Plummer, MD  Reason for Consult: Sudden onset right neck mass  HPI: 75 year old male on Eliquis for atrial fibrillation.  This morning he noticed sudden onset of swelling in the right lower neck this was not preceded by any trauma or severe coughing episode.  He denies any other sore throat no fever chills nausea or vomiting.  Since this morning he has noticed some increase in the swelling although that has not increased in size in the last hour or 2 he is having mild sore throat now but he is having no difficulty breathing no significant difficulty swallowing.  Allergies:  Allergies  Allergen Reactions   Indocin [Indomethacin] Other (See Comments)    "I bleed through my skin"    ROS: Review of systems normal other than 12 systems except per HPI.  PMH:  Past Medical History:  Diagnosis Date   A-fib ([place])    Diabetes mellitus without complication ([place])    Fuchs' corneal dystrophy    GERD (gastroesophageal reflux disease)    Hypercholesteremia    Hypertension    Melanoma of skin ([place])    Resected from his nose in 1980's   Neuropathy    Pneumonia    PTSD (post-traumatic stress disorder)     FH:  Family History  Problem Relation Age of Onset   Breast cancer Mother    Diabetes Mother     SH:  Social History   Socioeconomic History   Marital status: Married    Spouse name: Larry Cobb   Number of children: 2   Years of education: Not on file   Highest education level: Not on file  Occupational History   Not on file  Tobacco Use   Smoking status: Former    Types: Cigarettes    Quit date: 1992    Years since quitting: 30.7   Smokeless tobacco: Never  Vaping Use   Vaping Use: Never used  Substance and Sexual Activity   Alcohol use: No   Drug use: No   Sexual activity: Not on file  Other Topics Concern   Not on file  Social History Narrative   Not on file   Social Determinants of Health   Financial Resource Strain:  Not on file  Food Insecurity: Not on file  Transportation Needs: Not on file  Physical Activity: Not on file  Stress: Not on file  Social Connections: Not on file  Intimate Partner Violence: Not on file    PSH:  Past Surgical History:  Procedure Laterality Date   ABLATION  1992   cardiac   AMPUTATION TOE Right 09/11/2017   Procedure: 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;  Surgeon: Larry Cobb, DPM;  Location: [facility] ORS;  Service: Podiatry;  Laterality: Right;   APPENDECTOMY  1972   CATARACT EXTRACTION Right 10/21/2016   COLON SURGERY  2006   removed a foot of colon   COLONOSCOPY  05/25/2020   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  CN 2-12 grossly intact and symmetric. EAC/TMs normal BL. Oral cavity, lips, gums, ororpharynx normal with no masses or lesions. Skin warm and dry. Nasal cavity without polyps or purulence. External nose and ears without masses or lesions. EOMI, PERRLA. Neck exam with the patient's neck gently extended a right-sided thyroid mass is easily palpable.  This is nontender.  No other adenopathy is noted.  CT scan-I have reviewed this personally he has what appears to be a cystic mass within the right lobe of the thyroid consistent with an intrathyroidal bleed.  Does not appear to be infectious as he has a normal white count and no other symptoms of infection.  Is much more likely he bled into a cystic area within the gland and being on the blood thinner this has expanded.  Although his  trachea is shifted slightly there is no evidence of tracheal compression or significant airway compromise.   A/P: Right cystic thyroid mass with sudden onset most likely a intrathyroid bleed.  I would recommend an ultrasound of the thyroid and a possible FNA of the cystic area.  My suspicion is that this is related to his Eliquis.  Interventional radiology can assess this and drain under ultrasound guidance if this does have a sanguinous drainage it may be worthwhile to put a temporary catheter in position otherwise it is likely this will reaccumulate.  I any fluid which can be extracted I would recommend sending for culture and sensitivity in case of possible infection.  Although I do not think his airway is currently compromised if his symptoms worsen he should be easily intubated.  At present he is breathing fine with no stridor and a normal voice.  Would also recommending holding the Eliquis at this time.   Larry Cobb 06/18/2021 5:59 PM

## 2021-06-18 NOTE — ED Notes (Signed)
Pt trialing 8oz water per MD Bradler.  Pt reports throat pain of 8 with swallowing.  No coughing noted. MD notified.

## 2021-06-19 ENCOUNTER — Observation Stay: Payer: Medicare Other

## 2021-06-19 ENCOUNTER — Other Ambulatory Visit: Payer: Self-pay

## 2021-06-19 DIAGNOSIS — E7800 Pure hypercholesterolemia, unspecified: Secondary | ICD-10-CM | POA: Diagnosis present

## 2021-06-19 DIAGNOSIS — Z79899 Other long term (current) drug therapy: Secondary | ICD-10-CM | POA: Diagnosis not present

## 2021-06-19 DIAGNOSIS — Z7984 Long term (current) use of oral hypoglycemic drugs: Secondary | ICD-10-CM | POA: Diagnosis not present

## 2021-06-19 DIAGNOSIS — Z89421 Acquired absence of other right toe(s): Secondary | ICD-10-CM | POA: Diagnosis not present

## 2021-06-19 DIAGNOSIS — E876 Hypokalemia: Secondary | ICD-10-CM | POA: Diagnosis present

## 2021-06-19 DIAGNOSIS — Z20822 Contact with and (suspected) exposure to covid-19: Secondary | ICD-10-CM | POA: Diagnosis present

## 2021-06-19 DIAGNOSIS — R221 Localized swelling, mass and lump, neck: Secondary | ICD-10-CM | POA: Diagnosis present

## 2021-06-19 DIAGNOSIS — Z833 Family history of diabetes mellitus: Secondary | ICD-10-CM | POA: Diagnosis not present

## 2021-06-19 DIAGNOSIS — R1310 Dysphagia, unspecified: Secondary | ICD-10-CM | POA: Diagnosis present

## 2021-06-19 DIAGNOSIS — N179 Acute kidney failure, unspecified: Secondary | ICD-10-CM | POA: Diagnosis present

## 2021-06-19 DIAGNOSIS — F4310 Post-traumatic stress disorder, unspecified: Secondary | ICD-10-CM | POA: Diagnosis present

## 2021-06-19 DIAGNOSIS — Z96642 Presence of left artificial hip joint: Secondary | ICD-10-CM | POA: Diagnosis present

## 2021-06-19 DIAGNOSIS — I482 Chronic atrial fibrillation, unspecified: Secondary | ICD-10-CM | POA: Diagnosis present

## 2021-06-19 DIAGNOSIS — Z6837 Body mass index (BMI) 37.0-37.9, adult: Secondary | ICD-10-CM | POA: Diagnosis not present

## 2021-06-19 DIAGNOSIS — Z85820 Personal history of malignant melanoma of skin: Secondary | ICD-10-CM | POA: Diagnosis not present

## 2021-06-19 DIAGNOSIS — I10 Essential (primary) hypertension: Secondary | ICD-10-CM | POA: Diagnosis present

## 2021-06-19 DIAGNOSIS — Z7901 Long term (current) use of anticoagulants: Secondary | ICD-10-CM | POA: Diagnosis not present

## 2021-06-19 DIAGNOSIS — Z803 Family history of malignant neoplasm of breast: Secondary | ICD-10-CM | POA: Diagnosis not present

## 2021-06-19 DIAGNOSIS — E079 Disorder of thyroid, unspecified: Secondary | ICD-10-CM

## 2021-06-19 DIAGNOSIS — E1142 Type 2 diabetes mellitus with diabetic polyneuropathy: Secondary | ICD-10-CM | POA: Diagnosis present

## 2021-06-19 DIAGNOSIS — E041 Nontoxic single thyroid nodule: Secondary | ICD-10-CM | POA: Diagnosis present

## 2021-06-19 DIAGNOSIS — Z96612 Presence of left artificial shoulder joint: Secondary | ICD-10-CM | POA: Diagnosis present

## 2021-06-19 DIAGNOSIS — K219 Gastro-esophageal reflux disease without esophagitis: Secondary | ICD-10-CM | POA: Diagnosis present

## 2021-06-19 DIAGNOSIS — Z888 Allergy status to other drugs, medicaments and biological substances status: Secondary | ICD-10-CM | POA: Diagnosis not present

## 2021-06-19 LAB — GLUCOSE, CAPILLARY
Glucose-Capillary: 115 mg/dL — ABNORMAL HIGH (ref 70–99)
Glucose-Capillary: 121 mg/dL — ABNORMAL HIGH (ref 70–99)
Glucose-Capillary: 124 mg/dL — ABNORMAL HIGH (ref 70–99)
Glucose-Capillary: 125 mg/dL — ABNORMAL HIGH (ref 70–99)
Glucose-Capillary: 159 mg/dL — ABNORMAL HIGH (ref 70–99)

## 2021-06-19 LAB — HEMOGLOBIN A1C
Hgb A1c MFr Bld: 6 % — ABNORMAL HIGH (ref 4.8–5.6)
Mean Plasma Glucose: 126 mg/dL

## 2021-06-19 LAB — BASIC METABOLIC PANEL
Anion gap: 10 (ref 5–15)
BUN: 18 mg/dL (ref 8–23)
CO2: 25 mmol/L (ref 22–32)
Calcium: 9.2 mg/dL (ref 8.9–10.3)
Chloride: 100 mmol/L (ref 98–111)
Creatinine, Ser: 1.13 mg/dL (ref 0.61–1.24)
GFR, Estimated: >60 mL/min (ref >60)
Glucose, Bld: 119 mg/dL — ABNORMAL HIGH (ref 70–99)
Potassium: 3.1 mmol/L — ABNORMAL LOW (ref 3.5–5.1)
Sodium: 135 mmol/L (ref 135–145)

## 2021-06-19 MED ORDER — TRIAMTERENE-HCTZ 37.5-25 MG PO TABS
2.0000 | ORAL_TABLET | Freq: Every day | ORAL | Status: DC
  Administered 2021-06-19 – 2021-06-20 (×2): 2 via ORAL
  Filled 2021-06-19 (×2): qty 2

## 2021-06-19 MED ORDER — POTASSIUM CHLORIDE CRYS ER 20 MEQ PO TBCR
40.0000 meq | EXTENDED_RELEASE_TABLET | Freq: Once | ORAL | Status: AC
  Administered 2021-06-19: 40 meq via ORAL
  Filled 2021-06-19: qty 2

## 2021-06-19 NOTE — Progress Notes (Addendum)
PROGRESS NOTE    Larry Cobb  RXV:[id number] DOB: [birth year] DOA: 06/18/2021 PCP: Baxter Hire, MD    Assessment & Plan:   Active Problems:   Thyroid mass of unclear etiology   HTN (hypertension)   DM2 (diabetes mellitus, type 2) (HCC)   Atrial fibrillation, chronic (HCC)   PTSD (post-traumatic stress disorder)   Thyroid mass  Thyroid mass : etiology unclear. S/p FNA of thyroid mass today. Cytology is pending. Continue to hold home dose of eliquis   Likely chronic a. Fib: continue on hold eliquis. Continue on amlodipine   Morbid obesity: BMI 37.8. Complicates overall care & prognosis   DM2: well controlled w/ HbA1c 5.6. Hold metformin. Continue on SSI w/ accuchecks   Hypokalemia: KCl repleated    HTN: continue on amlodipine    AKI: Cr is trending down from day prior. Continue on IVFs    PTSD: continue on home dose of lamotrigine    DVT prophylaxis: SCDs Code Status: full  Family Communication:  Disposition Plan: likely d/c back home  Level of care: Med-Surg  Status is: Inpatient  Remains inpatient appropriate because:IV treatments appropriate due to intensity of illness or inability to take PO and Inpatient level of care appropriate due to severity of illness  Dispo: The patient is from: Home              Anticipated d/c is to: Home              Patient currently is not medically stable to d/c.   Difficult to place patient : unclear          Consultants:   Procedures:   Antimicrobials:  Subjective: Pt c/o malaise  Objective: Vitals:   06/19/21 0001 06/19/21 0519 06/19/21 0801 06/19/21 1542  BP: 137/78 125/65 119/69 122/72  Pulse: 78 (!) 57 62 61  Resp: 18 18 18 18   Temp: 97.7 F (36.5 C) 97.8 F (36.6 C) 98.6 F (37 C) 98.5 F (36.9 C)  TempSrc: Oral Oral Oral Oral  SpO2: 96% 96% 97% 95%  Weight:      Height:        Intake/Output Summary (Last 24 hours) at 06/19/2021 1602 Last data filed at 06/19/2021 1404 Gross per 24 hour   Intake 240 ml  Output --  Net 240 ml   Filed Weights   06/18/21 1437  Weight: 133.8 kg    Examination:  General exam: Appears calm and comfortable  Respiratory system: Clear to auscultation. Respiratory effort normal. Cardiovascular system: S1 & S2+. No  rubs, gallops or clicks.  Gastrointestinal system: Abdomen is nondistended, soft and nontender. Normal bowel sounds heard. Central nervous system: Alert and oriented. Moves all extremities  Psychiatry: Judgement and insight appear normal. Flat mood and affect    Data Reviewed: I have personally reviewed following labs and imaging studies  CBC: Recent Labs  Lab 06/18/21 1439  WBC 7.8  HGB 15.9  HCT 44.0  MCV 88.5  PLT 509   Basic Metabolic Panel: Recent Labs  Lab 06/18/21 1439 06/19/21 0558  NA 133* 135  K 3.5 3.1*  CL 97* 100  CO2 27 25  GLUCOSE 144* 119*  BUN 22 18  CREATININE 1.42* 1.13  CALCIUM 9.3 9.2   GFR: Estimated Creatinine Clearance: 82.1 mL/min (by C-G formula based on SCr of 1.13 mg/dL). Liver Function Tests: Recent Labs  Lab 06/18/21 1439  AST 17  ALT 19  ALKPHOS 76  BILITOT 1.8*  PROT 7.6  ALBUMIN  4.3   No results for input(s): LIPASE, AMYLASE in the last 168 hours. No results for input(s): AMMONIA in the last 168 hours. Coagulation Profile: No results for input(s): INR, PROTIME in the last 168 hours. Cardiac Enzymes: No results for input(s): CKTOTAL, CKMB, CKMBINDEX, TROPONINI in the last 168 hours. BNP (last 3 results) No results for input(s): PROBNP in the last 8760 hours. HbA1C: No results for input(s): HGBA1C in the last 72 hours. CBG: Recent Labs  Lab 06/19/21 0011 06/19/21 0801 06/19/21 1136  GLUCAP 124* 125* 115*   Lipid Profile: No results for input(s): CHOL, HDL, LDLCALC, TRIG, CHOLHDL, LDLDIRECT in the last 72 hours. Thyroid Function Tests: Recent Labs    06/18/21 1641  TSH 3.239  FREET4 1.07   Anemia Panel: No results for input(s): VITAMINB12, FOLATE,  FERRITIN, TIBC, IRON, RETICCTPCT in the last 72 hours. Sepsis Labs: No results for input(s): PROCALCITON, LATICACIDVEN in the last 168 hours.  Recent Results (from the past 240 hour(s))  Resp Panel by RT-PCR (Flu A&B, Covid) Nasopharyngeal Swab     Status: None   Collection Time: 06/18/21  4:41 PM   Specimen: Nasopharyngeal Swab; Nasopharyngeal(NP) swabs in vial transport medium  Result Value Ref Range Status   SARS Coronavirus 2 by RT PCR NEGATIVE NEGATIVE Final    Comment: (NOTE) SARS-CoV-2 target nucleic acids are NOT DETECTED.  The SARS-CoV-2 RNA is generally detectable in upper respiratory specimens during the acute phase of infection. The lowest concentration of SARS-CoV-2 viral copies this assay can detect is 138 copies/mL. A negative result does not preclude SARS-Cov-2 infection and should not be used as the sole basis for treatment or other patient management decisions. A negative result may occur with  improper specimen collection/handling, submission of specimen other than nasopharyngeal swab, presence of viral mutation(s) within the areas targeted by this assay, and inadequate number of viral copies(<138 copies/mL). A negative result must be combined with clinical observations, patient history, and epidemiological information. The expected result is Negative.  Fact Sheet for Patients:  EntrepreneurPulse.com.au  Fact Sheet for Healthcare Providers:  IncredibleEmployment.be  This test is no t yet approved or cleared by the [place] FDA and  has been authorized for detection and/or diagnosis of SARS-CoV-2 by FDA under an Emergency Use Authorization (EUA). This EUA will remain  in effect (meaning this test can be used) for the duration of the COVID-19 declaration under Section 564(b)(1) of the Act, 21 U.S.C.section 360bbb-3(b)(1), unless the authorization is terminated  or revoked sooner.       Influenza A by PCR NEGATIVE  NEGATIVE Final   Influenza B by PCR NEGATIVE NEGATIVE Final    Comment: (NOTE) The Xpert Xpress SARS-CoV-2/FLU/RSV plus assay is intended as an aid in the diagnosis of influenza from Nasopharyngeal swab specimens and should not be used as a sole basis for treatment. Nasal washings and aspirates are unacceptable for Xpert Xpress SARS-CoV-2/FLU/RSV testing.  Fact Sheet for Patients: EntrepreneurPulse.com.au  Fact Sheet for Healthcare Providers: IncredibleEmployment.be  This test is not yet approved or cleared by the [place] FDA and has been authorized for detection and/or diagnosis of SARS-CoV-2 by FDA under an Emergency Use Authorization (EUA). This EUA will remain in effect (meaning this test can be used) for the duration of the COVID-19 declaration under Section 564(b)(1) of the Act, 21 U.S.C. section 360bbb-3(b)(1), unless the authorization is terminated or revoked.  Performed at [facility], [address], [place], [place] [address]  Radiology Studies: CT Angio Neck W and/or Wo Contrast  Result Date: 06/18/2021 CLINICAL DATA:  Stroke/TIA, assess extracranial arteries. Right neck pain and swelling, eval for aneurysm. EXAM: CT ANGIOGRAPHY NECK TECHNIQUE: Multidetector CT imaging of the neck was performed using the standard protocol during bolus administration of intravenous contrast. Multiplanar CT image reconstructions and MIPs were obtained to evaluate the vascular anatomy. Carotid stenosis measurements (when applicable) are obtained utilizing NASCET criteria, using the distal internal carotid diameter as the denominator. CONTRAST:  35mL OMNIPAQUE IOHEXOL 350 MG/ML SOLN COMPARISON:  None. FINDINGS: Aortic arch: Standard 3 vessel aortic arch with mild calcified plaque. Widely patent arch vessel origins. Right carotid system: Patent with a small amount of calcified and soft plaque at the carotid bifurcation. No evidence  of a significant stenosis or dissection. Left carotid system: Patent with a small amount of calcified plaque in the proximal ICA. No evidence of a significant stenosis or dissection. Vertebral arteries: The vertebral arteries are patent with mild atherosclerotic plaque in the V3 and V4 segments bilaterally. There is no evidence of a significant stenosis or dissection. The left vertebral artery is moderately dominant. Skeleton: Advanced disc degeneration from C4-5 to C7-T1. Advanced facet arthrosis on the right at C2-3 and on the left at C3-4 and C4-5 with facet ankylosis at the latter. Other neck: 4.9 x 5.6 x 6.5 cm (AP x transverse x craniocaudal) hypoattenuating mass arising from the right thyroid lobe displacing the internal jugular vein laterally in the common carotid artery posteriorly. No significant mass effect on the airway. No enlarged lymph nodes in the neck. Upper chest: Clear lung apices. IMPRESSION: 1. Mild atherosclerosis without significant stenosis or aneurysm in the neck. 2. 6.5 cm right thyroid mass. Recommend thyroid US. Reference: J Am Coll Radiol. 2015 Feb;12(2): 143-50 3. Aortic Atherosclerosis (ICD10-I70.0). Electronically Signed   By: Logan Bores M.D.   On: 06/18/2021 15:48   US THYROID  Result Date: 06/19/2021 CLINICAL DATA:  Right thyroid mass noted on CTA neck EXAM: THYROID ULTRASOUND TECHNIQUE: Ultrasound examination of the thyroid gland and adjacent soft tissues was performed. COMPARISON:  CTA of the same day FINDINGS: Parenchymal Echotexture: Moderately heterogenous Isthmus: 0.6 cm thickness Right lobe: 7 x 5 x 5.5 cm Left lobe: 3.2 x 1.8 x 1.2 cm _________________________________________________________ Estimated total number of nodules >/= 1 cm: 2 Number of spongiform nodules >/=  2 cm not described below (TR1): 0 Number of mixed cystic and solid nodules >/= 1.5 cm not described below (TR2): 0 _________________________________________________________ Nodule # 1: Location: Right;  mid Maximum size: 6.4 cm; Other 2 dimensions: 5 x 4.9 cm Composition: solid/almost completely solid (2) Echogenicity: isoechoic (1) Shape: not taller-than-wide (0) Margins: smooth (0) Echogenic foci: none (0) ACR TI-RADS total points: 3. ACR TI-RADS risk category: TR3 (3 points). ACR TI-RADS recommendations: **Given size (>/= 2.5 cm) and appearance, fine needle aspiration of this mildly suspicious nodule should be considered based on TI-RADS criteria. _________________________________________________________ Nodule 2: 0.9 cm complex cyst, superior left; This nodule does NOT meet TI-RADS criteria for biopsy or dedicated follow-up. Nodule 3: 0.6 cm complex cyst, mid left; This nodule does NOT meet TI-RADS criteria for biopsy or dedicated follow-up. Nodule # 4: Location: Left; medial Maximum size: 1.5 cm; Other 2 dimensions: 0.6 x 0.9 cm Composition: cannot determine (2) Echogenicity: hypoechoic (2) Shape: not taller-than-wide (0) Margins: smooth (0) Echogenic foci: none (0) ACR TI-RADS total points: 4. ACR TI-RADS risk category: TR4 (4-6 points). ACR TI-RADS recommendations: **Given size (>/= 1.5 cm) and  appearance, fine needle aspiration of this moderately suspicious nodule should be considered based on TI-RADS criteria. _________________________________________________________ No regional cervical adenopathy identified. IMPRESSION: 1. Asymmetric thyromegaly with nodules as above. 2. Recommend FNA biopsy of BOTH the 6.4 cm mid right AND 1.5 cm medial left is moderately suspicious nodules. The above is in keeping with the ACR TI-RADS recommendations - J Am Coll Radiol 2017;14:587-595. Electronically Signed   By: Lucrezia Europe M.D.   On: 06/19/2021 07:49        Scheduled Meds:  amLODipine  10 mg Oral Daily   ARIPiprazole  10 mg Oral QHS   docusate sodium  100 mg Oral BID   insulin aspart  0-20 Units Subcutaneous TID WC   lamoTRIgine  150 mg Oral BID   pantoprazole  40 mg Oral Daily   potassium chloride  40  mEq Oral Once   prednisoLONE acetate  1 drop Right Eye Daily   sodium chloride flush  3 mL Intravenous Q12H   traZODone  250 mg Oral QHS   triamterene-hydrochlorothiazide  2 tablet Oral Daily   Continuous Infusions:  sodium chloride 100 mL/hr at 06/19/21 1050   sodium chloride       LOS: 0 days    Time spent: 32 mins     Wyvonnia Dusky, MD [facility] Pager [phone]  If 7PM-7AM, please contact night-coverage 06/19/2021, 4:02 PM

## 2021-06-19 NOTE — Plan of Care (Signed)
Pt admitted for thyroid inflamation. BP 137/78 (BP Location: Left Arm)   Pulse 78   Temp 97.7 F (36.5 C) (Oral)   Resp 18   Ht 6\' 2"  (1.88 m)   Wt 133.8 kg   SpO2 96%   BMI 37.88 kg/m  WNL. Blood sugar WNL. Pt had no skin issues A&OX4, pt had no c/o pain at this time. Call bell in reach, bed in lowest position, 2/4 rails up and no further needs voiced at tthis time. Louanne Skye 06/19/21 1:49 AM

## 2021-06-19 NOTE — Procedures (Signed)
PROCEDURE SUMMARY:  Successful US guided FNA biopsy of 6.4 cm right mid thyroid nodule.  Nodule is solid in composition, attempted to aspirate with 18G however no fluid was able to be aspirated. No immediate complications.  Pt tolerated well.   Left sided medial nodule # 4 was scanned with [place] and does not appear to be a nodule today, however elongates with muscle belly outside of thyroid and felt to be part of the muscle. Findings were reviewed and discussed with Dr. Vernard Gambles today who agrees with the above.   Specimen was sent for labs.  EBL N/A  Hedy Jacob PA-C 06/19/2021 2:45 PM

## 2021-06-20 DIAGNOSIS — I482 Chronic atrial fibrillation, unspecified: Secondary | ICD-10-CM | POA: Diagnosis not present

## 2021-06-20 DIAGNOSIS — E079 Disorder of thyroid, unspecified: Secondary | ICD-10-CM | POA: Diagnosis not present

## 2021-06-20 LAB — CBC
HCT: 40.7 % (ref 39.0–52.0)
Hemoglobin: 14.4 g/dL (ref 13.0–17.0)
MCH: 31.5 pg (ref 26.0–34.0)
MCHC: 35.4 g/dL (ref 30.0–36.0)
MCV: 89.1 fL (ref 80.0–100.0)
Platelets: 178 10*3/uL (ref 150–400)
RBC: 4.57 MIL/uL (ref 4.22–5.81)
RDW: 12.4 % (ref 11.5–15.5)
WBC: 7.8 10*3/uL (ref 4.0–10.5)
nRBC: 0 % (ref 0.0–0.2)

## 2021-06-20 LAB — BASIC METABOLIC PANEL
Anion gap: 5 (ref 5–15)
BUN: 20 mg/dL (ref 8–23)
CO2: 26 mmol/L (ref 22–32)
Calcium: 8.7 mg/dL — ABNORMAL LOW (ref 8.9–10.3)
Chloride: 103 mmol/L (ref 98–111)
Creatinine, Ser: 1.1 mg/dL (ref 0.61–1.24)
GFR, Estimated: >60 mL/min (ref >60)
Glucose, Bld: 119 mg/dL — ABNORMAL HIGH (ref 70–99)
Potassium: 3.6 mmol/L (ref 3.5–5.1)
Sodium: 134 mmol/L — ABNORMAL LOW (ref 135–145)

## 2021-06-20 LAB — GLUCOSE, CAPILLARY
Glucose-Capillary: 134 mg/dL — ABNORMAL HIGH (ref 70–99)
Glucose-Capillary: 96 mg/dL (ref 70–99)

## 2021-06-20 LAB — CYTOLOGY - NON PAP

## 2021-06-20 NOTE — Discharge Summary (Signed)
Physician Discharge Summary  Larry Cobb:[id number] DOB: November 07, 1945 DOA: 06/18/2021  PCP: Baxter Hire, MD  Admit date: 06/18/2021 Discharge date: 06/20/2021  Admitted From: home  Disposition: home  Recommendations for Outpatient Follow-up:  Follow up with PCP in 1-2 weeks F/u w/ ENT, Dr. Tami Ribas, in 1-2 weeks to f/u on thyroid FNA results   Home Health: no  Equipment/Devices:  Discharge Condition: stable  CODE STATUS: full  Diet recommendation: Heart Healthy / Carb Modified   Brief/Interim Summary: HPI was taken from Dr. Linda Hedges: Larry Cobb is a 75 y.o. male with medical history significant of DM w/o complications, DERD, HTN,HLD who noticed swelling of the rightlower neck thisAM. He denies any trauma, denies severe coughing. He has had no infectious symptoms: no fever, chills, purulent rhinorrhea, sore throat. The swelling did increase over the early morning. He has had some swallowing difficulty, c/o sore throat, no respiratory distress, no stridor. He presents to [facility] for evaluation.     ED Course: T97.8  160/93  HR 78  R 15. EDP exam - easily palpable mass right neck. Lab: CBC nl, Glucose 144, Cr 1.42 (baseline 1.10 11/14/20). TSH 3.29, Free T4 1.07. CTA neck - 6.5 cm right thyroid mass with some shift of esophagus but no compromise to other structures. U/S thyroid pending. Seen by Dr. Tami Ribas for ENT in the ED - no emergent intervention indicated. Recommends aspiration under imaging by IR. [facility] called to admit patient for continue evaluation and treatment.   IR was consulted for FNA of thyroid mass. As per IR PA, Successful US guided FNA biopsy of 6.4 cm right mid thyroid nodule. Nodule is solid in composition, attempted to aspirate with 18G however no fluid was able to be aspirated. No immediate complications. Pt tolerated well.    Left sided medial nodule # 4 was scanned with [place] and does not appear to be a nodule today, however elongates with muscle belly outside of  thyroid and felt to be part of the muscle. Findings were reviewed and discussed with Dr. Vernard Gambles today who agrees with the above.    Specimen was sent for labs.  Pt will f/u w/ ENT, Dr. Tami Ribas, to get FNA results.   Discharge Diagnoses:  Active Problems:   Thyroid mass of unclear etiology   HTN (hypertension)   DM2 (diabetes mellitus, type 2) (HCC)   Atrial fibrillation, chronic (HCC)   PTSD (post-traumatic stress disorder)   Thyroid mass  Thyroid mass : etiology unclear. S/p FNA of thyroid mass 06/19/21. Cytology is pending.   Likely chronic a. fib:  Continue on amlodipine, eliquis     Morbid obesity: BMI 37.8. Complicates overall care & prognosis    DM2: well controlled w/ HbA1c 5.6. Hold metformin. Continue on SSI w/ accuchecks    Hypokalemia: WNL today    HTN: continue on amlodipine    AKI: Cr is trending down slightly from day prior.   PTSD: continue on home dose of lamotrigine   Discharge Instructions  Discharge Instructions     Diet - low sodium heart healthy   Complete by: As directed    Diet Carb Modified   Complete by: As directed    Discharge instructions   Complete by: As directed    F/u w/ PCP in 1-2 weeks to get thyroid FNA results   Increase activity slowly   Complete by: As directed       Allergies as of 06/20/2021       Reactions  Indocin [indomethacin] Other (See Comments)   "I bleed through my skin"        Medication List     TAKE these medications    amLODipine 10 MG tablet Commonly known as: NORVASC Take 10 mg by mouth daily.   apixaban 5 MG Tabs tablet Commonly known as: ELIQUIS Take 5 mg by mouth 2 (two) times daily.   ARIPiprazole 10 MG tablet Commonly known as: ABILIFY Take 10 mg by mouth at bedtime.   Cholecalciferol 25 MCG (1000 UT) tablet Take 1,000 Units by mouth daily.   docusate sodium 100 MG capsule Commonly known as: COLACE Take 1 capsule (100 mg total) by mouth 2 (two) times daily.   lamoTRIgine 150 MG  tablet Commonly known as: LAMICTAL Take 1 tablet (150 mg total) by mouth 2 (two) times daily.   metFORMIN 850 MG tablet Commonly known as: GLUCOPHAGE Take 1 tablet (850 mg total) by mouth 2 (two) times daily with a meal. What changed: when to take this   methocarbamol 500 MG tablet Commonly known as: ROBAXIN Take 1 tablet (500 mg total) by mouth every 6 (six) hours as needed for muscle spasms.   multivitamin with minerals tablet Take 1 tablet by mouth daily.   omeprazole 20 MG capsule Commonly known as: PRILOSEC Take 20 mg by mouth every evening.   Oxycodone HCl 10 MG Tabs Take 1-1.5 tablets (10-15 mg total) by mouth every 4 (four) hours as needed for severe pain (pain score 7-10).   prednisoLONE acetate 1 % ophthalmic suspension Commonly known as: PRED FORTE Place 1 drop into the right eye daily.   traZODone 100 MG tablet Commonly known as: DESYREL Take 250 mg by mouth at bedtime.   triamterene-hydrochlorothiazide 75-50 MG tablet Commonly known as: MAXZIDE Take 1 tablet by mouth daily.        Allergies  Allergen Reactions   Indocin [Indomethacin] Other (See Comments)    "I bleed through my skin"    Consultations: IR   Procedures/Studies: CT Angio Neck W and/or Wo Contrast  Result Date: 06/18/2021 CLINICAL DATA:  Stroke/TIA, assess extracranial arteries. Right neck pain and swelling, eval for aneurysm. EXAM: CT ANGIOGRAPHY NECK TECHNIQUE: Multidetector CT imaging of the neck was performed using the standard protocol during bolus administration of intravenous contrast. Multiplanar CT image reconstructions and MIPs were obtained to evaluate the vascular anatomy. Carotid stenosis measurements (when applicable) are obtained utilizing NASCET criteria, using the distal internal carotid diameter as the denominator. CONTRAST:  82mL OMNIPAQUE IOHEXOL 350 MG/ML SOLN COMPARISON:  None. FINDINGS: Aortic arch: Standard 3 vessel aortic arch with mild calcified plaque. Widely  patent arch vessel origins. Right carotid system: Patent with a small amount of calcified and soft plaque at the carotid bifurcation. No evidence of a significant stenosis or dissection. Left carotid system: Patent with a small amount of calcified plaque in the proximal ICA. No evidence of a significant stenosis or dissection. Vertebral arteries: The vertebral arteries are patent with mild atherosclerotic plaque in the V3 and V4 segments bilaterally. There is no evidence of a significant stenosis or dissection. The left vertebral artery is moderately dominant. Skeleton: Advanced disc degeneration from C4-5 to C7-T1. Advanced facet arthrosis on the right at C2-3 and on the left at C3-4 and C4-5 with facet ankylosis at the latter. Other neck: 4.9 x 5.6 x 6.5 cm (AP x transverse x craniocaudal) hypoattenuating mass arising from the right thyroid lobe displacing the internal jugular vein laterally in the common carotid artery posteriorly.  No significant mass effect on the airway. No enlarged lymph nodes in the neck. Upper chest: Clear lung apices. IMPRESSION: 1. Mild atherosclerosis without significant stenosis or aneurysm in the neck. 2. 6.5 cm right thyroid mass. Recommend thyroid US. Reference: J Am Coll Radiol. 2015 Feb;12(2): 143-50 3. Aortic Atherosclerosis (ICD10-I70.0). Electronically Signed   By: Logan Bores M.D.   On: 06/18/2021 15:48   US THYROID FNA EACH NODULE  Result Date: 06/19/2021 INDICATION: Indeterminate thyroid nodule EXAM: ULTRASOUND GUIDED FINE NEEDLE ASPIRATION OF INDETERMINATE THYROID NODULE COMPARISON:  Ultrasound thyroid same day. MEDICATIONS: Local 1% lidocaine only. COMPLICATIONS: None immediate. TECHNIQUE: Informed written consent was obtained from the patient after a discussion of the risks, benefits and alternatives to treatment. Questions regarding the procedure were encouraged and answered. A timeout was performed prior to the initiation of the procedure. Pre-procedural ultrasound  scanning demonstrated unchanged size and appearance of the indeterminate nodule within the right thyroid lobe. Pre-procedural ultrasound scanning of the left thyroid lobe specifically the previously numbered nodule #4, left medial nodule was scanned today and does not appear to be a nodule, however appears to elongate with the muscle belly outside of the thyroid and felt to be a part of the muscle. The procedure was planned. The neck was prepped in the usual sterile fashion, and a sterile drape was applied covering the operative field. A timeout was performed prior to the initiation of the procedure. Local anesthesia was provided with 1% lidocaine. Under direct ultrasound guidance, 6 FNA biopsies were performed of the solid right mid thyroid nodule with a 25 gauge needle. A 18 gauge needle was placed into the nodule with attempt for aspiration which yielded trace amount of bloody fluid, no additional fluid was able to be aspirated. Multiple ultrasound images were saved for procedural documentation purposes. The samples were prepared and submitted to pathology. Limited post procedural scanning was negative for hematoma or additional complication. Dressings were placed. The patient tolerated the above procedures procedure well without immediate postprocedural complication. FINDINGS: FINDINGS Nodule reference number based on prior diagnostic ultrasound: 1 Maximum size: 6.4 cm Location: Right  ;  Mid ACR TI-RADS total points: 3 ACR TI-RADS risk category:  TR3 (3 points) Prior biopsy:  No Reason for biopsy: meets ACR TI-RADS criteria Ultrasound imaging confirms appropriate placement of the needles within the thyroid nodule. IMPRESSION: Technically successful ultrasound guided fine needle aspiration biopsy of solid-appearing right mid thyroid nodule. Pre-procedural ultrasound scanning of the left thyroid lobe specifically the previously numbered nodule #4, left medial nodule was scanned today and does not appear to be a  nodule, however appears to elongate with the muscle belly outside of the thyroid and felt to be a part of the muscle. Therefore, no biopsy is indicated. Read By: Tsosie Billing PA-C Electronically Signed   By: Lucrezia Europe M.D.   On: 06/19/2021 16:13   US THYROID  Result Date: 06/19/2021 CLINICAL DATA:  Right thyroid mass noted on CTA neck EXAM: THYROID ULTRASOUND TECHNIQUE: Ultrasound examination of the thyroid gland and adjacent soft tissues was performed. COMPARISON:  CTA of the same day FINDINGS: Parenchymal Echotexture: Moderately heterogenous Isthmus: 0.6 cm thickness Right lobe: 7 x 5 x 5.5 cm Left lobe: 3.2 x 1.8 x 1.2 cm _________________________________________________________ Estimated total number of nodules >/= 1 cm: 2 Number of spongiform nodules >/=  2 cm not described below (TR1): 0 Number of mixed cystic and solid nodules >/= 1.5 cm not described below ([place]): 0 _________________________________________________________ Nodule # 1: Location: Right;  mid Maximum size: 6.4 cm; Other 2 dimensions: 5 x 4.9 cm Composition: solid/almost completely solid (2) Echogenicity: isoechoic (1) Shape: not taller-than-wide (0) Margins: smooth (0) Echogenic foci: none (0) ACR TI-RADS total points: 3. ACR TI-RADS risk category: TR3 (3 points). ACR TI-RADS recommendations: **Given size (>/= 2.5 cm) and appearance, fine needle aspiration of this mildly suspicious nodule should be considered based on TI-RADS criteria. _________________________________________________________ Nodule 2: 0.9 cm complex cyst, superior left; This nodule does NOT meet TI-RADS criteria for biopsy or dedicated follow-up. Nodule 3: 0.6 cm complex cyst, mid left; This nodule does NOT meet TI-RADS criteria for biopsy or dedicated follow-up. Nodule # 4: Location: Left; medial Maximum size: 1.5 cm; Other 2 dimensions: 0.6 x 0.9 cm Composition: cannot determine (2) Echogenicity: hypoechoic (2) Shape: not taller-than-wide (0) Margins: smooth (0)  Echogenic foci: none (0) ACR TI-RADS total points: 4. ACR TI-RADS risk category: TR4 (4-6 points). ACR TI-RADS recommendations: **Given size (>/= 1.5 cm) and appearance, fine needle aspiration of this moderately suspicious nodule should be considered based on TI-RADS criteria. _________________________________________________________ No regional cervical adenopathy identified. IMPRESSION: 1. Asymmetric thyromegaly with nodules as above. 2. Recommend FNA biopsy of BOTH the 6.4 cm mid right AND 1.5 cm medial left is moderately suspicious nodules. The above is in keeping with the ACR TI-RADS recommendations - J Am Coll Radiol 2017;14:587-595. Electronically Signed   By: Lucrezia Europe M.D.   On: 06/19/2021 07:49   (Echo, Carotid, EGD, Colonoscopy, ERCP)    Subjective: Pt denies any complaints    Discharge Exam: Vitals:   06/20/21 0552 06/20/21 0744  BP: 128/86 123/76  Pulse: 60 64  Resp: 18 20  Temp: 97.8 F (36.6 C) 97.8 F (36.6 C)  SpO2: 98% 100%   Vitals:   06/19/21 1542 06/19/21 2109 06/20/21 0552 06/20/21 0744  BP: 122/72 140/74 128/86 123/76  Pulse: 61 68 60 64  Resp: 18 16 18 20   Temp: 98.5 F (36.9 C) 98 F (36.7 C) 97.8 F (36.6 C) 97.8 F (36.6 C)  TempSrc: Oral  Oral   SpO2: 95% 97% 98% 100%  Weight:      Height:        General: Pt is alert, awake, not in acute distress Cardiovascular: S1/S2 +, no rubs, no gallops Respiratory: CTA bilaterally, no wheezing, no rhonchi Abdominal: Soft, NT, obese, bowel sounds + Extremities:  no cyanosis    The results of significant diagnostics from this hospitalization (including imaging, microbiology, ancillary and laboratory) are listed below for reference.     Microbiology: Recent Results (from the past 240 hour(s))  Resp Panel by RT-PCR (Flu A&B, Covid) Nasopharyngeal Swab     Status: None   Collection Time: 06/18/21  4:41 PM   Specimen: Nasopharyngeal Swab; Nasopharyngeal(NP) swabs in vial transport medium  Result Value Ref  Range Status   SARS Coronavirus 2 by RT PCR NEGATIVE NEGATIVE Final    Comment: (NOTE) SARS-CoV-2 target nucleic acids are NOT DETECTED.  The SARS-CoV-2 RNA is generally detectable in upper respiratory specimens during the acute phase of infection. The lowest concentration of SARS-CoV-2 viral copies this assay can detect is 138 copies/mL. A negative result does not preclude SARS-Cov-2 infection and should not be used as the sole basis for treatment or other patient management decisions. A negative result may occur with  improper specimen collection/handling, submission of specimen other than nasopharyngeal swab, presence of viral mutation(s) within the areas targeted by this assay, and inadequate number of viral copies(<138 copies/mL). A negative  result must be combined with clinical observations, patient history, and epidemiological information. The expected result is Negative.  Fact Sheet for Patients:  EntrepreneurPulse.com.au  Fact Sheet for Healthcare Providers:  IncredibleEmployment.be  This test is no t yet approved or cleared by the [place] FDA and  has been authorized for detection and/or diagnosis of SARS-CoV-2 by FDA under an Emergency Use Authorization (EUA). This EUA will remain  in effect (meaning this test can be used) for the duration of the COVID-19 declaration under Section 564(b)(1) of the Act, 21 U.S.C.section 360bbb-3(b)(1), unless the authorization is terminated  or revoked sooner.       Influenza A by PCR NEGATIVE NEGATIVE Final   Influenza B by PCR NEGATIVE NEGATIVE Final    Comment: (NOTE) The Xpert Xpress SARS-CoV-2/FLU/RSV plus assay is intended as an aid in the diagnosis of influenza from Nasopharyngeal swab specimens and should not be used as a sole basis for treatment. Nasal washings and aspirates are unacceptable for Xpert Xpress SARS-CoV-2/FLU/RSV testing.  Fact Sheet for  Patients: EntrepreneurPulse.com.au  Fact Sheet for Healthcare Providers: IncredibleEmployment.be  This test is not yet approved or cleared by the [place] FDA and has been authorized for detection and/or diagnosis of SARS-CoV-2 by FDA under an Emergency Use Authorization (EUA). This EUA will remain in effect (meaning this test can be used) for the duration of the COVID-19 declaration under Section 564(b)(1) of the Act, 21 U.S.C. section 360bbb-3(b)(1), unless the authorization is terminated or revoked.  Performed at [facility], [address] [place], [place] [address]      Labs: BNP (last 3 results) No results for input(s): BNP in the last 8760 hours. Basic Metabolic Panel: Recent Labs  Lab 06/18/21 1439 06/19/21 0558 06/20/21 0359  NA 133* 135 134*  K 3.5 3.1* 3.6  CL 97* 100 103  CO2 27 25 26   GLUCOSE 144* 119* 119*  BUN 22 18 20   CREATININE 1.42* 1.13 1.10  CALCIUM 9.3 9.2 8.7*   Liver Function Tests: Recent Labs  Lab 06/18/21 1439  AST 17  ALT 19  ALKPHOS 76  BILITOT 1.8*  PROT 7.6  ALBUMIN 4.3   No results for input(s): LIPASE, AMYLASE in the last 168 hours. No results for input(s): AMMONIA in the last 168 hours. CBC: Recent Labs  Lab 06/18/21 1439 06/20/21 0359  WBC 7.8 7.8  HGB 15.9 14.4  HCT 44.0 40.7  MCV 88.5 89.1  PLT 201 178   Cardiac Enzymes: No results for input(s): CKTOTAL, CKMB, CKMBINDEX, TROPONINI in the last 168 hours. BNP: Invalid input(s): POCBNP CBG: Recent Labs  Lab 06/19/21 0801 06/19/21 1136 06/19/21 1636 06/19/21 2118 06/20/21 0746  GLUCAP 125* 115* 121* 159* 134*   D-Dimer No results for input(s): DDIMER in the last 72 hours. Hgb A1c Recent Labs    06/18/21 1439  HGBA1C 6.0*   Lipid Profile No results for input(s): CHOL, HDL, LDLCALC, TRIG, CHOLHDL, LDLDIRECT in the last 72 hours. Thyroid function studies Recent Labs    06/18/21 1641  TSH 3.239    Anemia work up No results for input(s): VITAMINB12, FOLATE, FERRITIN, TIBC, IRON, RETICCTPCT in the last 72 hours. Urinalysis    Component Value Date/Time   COLORURINE YELLOW (A) 11/08/2020 1204   APPEARANCEUR HAZY (A) 11/08/2020 1204   LABSPEC 1.017 11/08/2020 1204   PHURINE 6.0 11/08/2020 [place] 11/08/2020 1204   HGBUR NEGATIVE 11/08/2020 [place] 11/08/2020 [place] 11/08/2020 1204  PROTEINUR NEGATIVE 11/08/2020 1204   NITRITE NEGATIVE 11/08/2020 [place] 11/08/2020 1204   Sepsis Labs Invalid input(s): PROCALCITONIN,  WBC,  LACTICIDVEN Microbiology Recent Results (from the past 240 hour(s))  Resp Panel by RT-PCR (Flu A&B, Covid) Nasopharyngeal Swab     Status: None   Collection Time: 06/18/21  4:41 PM   Specimen: Nasopharyngeal Swab; Nasopharyngeal(NP) swabs in vial transport medium  Result Value Ref Range Status   SARS Coronavirus 2 by RT PCR NEGATIVE NEGATIVE Final    Comment: (NOTE) SARS-CoV-2 target nucleic acids are NOT DETECTED.  The SARS-CoV-2 RNA is generally detectable in upper respiratory specimens during the acute phase of infection. The lowest concentration of SARS-CoV-2 viral copies this assay can detect is 138 copies/mL. A negative result does not preclude SARS-Cov-2 infection and should not be used as the sole basis for treatment or other patient management decisions. A negative result may occur with  improper specimen collection/handling, submission of specimen other than nasopharyngeal swab, presence of viral mutation(s) within the areas targeted by this assay, and inadequate number of viral copies(<138 copies/mL). A negative result must be combined with clinical observations, patient history, and epidemiological information. The expected result is Negative.  Fact Sheet for Patients:  EntrepreneurPulse.com.au  Fact Sheet for Healthcare Providers:   IncredibleEmployment.be  This test is no t yet approved or cleared by the [place] FDA and  has been authorized for detection and/or diagnosis of SARS-CoV-2 by FDA under an Emergency Use Authorization (EUA). This EUA will remain  in effect (meaning this test can be used) for the duration of the COVID-19 declaration under Section 564(b)(1) of the Act, 21 U.S.C.section 360bbb-3(b)(1), unless the authorization is terminated  or revoked sooner.       Influenza A by PCR NEGATIVE NEGATIVE Final   Influenza B by PCR NEGATIVE NEGATIVE Final    Comment: (NOTE) The Xpert Xpress SARS-CoV-2/FLU/RSV plus assay is intended as an aid in the diagnosis of influenza from Nasopharyngeal swab specimens and should not be used as a sole basis for treatment. Nasal washings and aspirates are unacceptable for Xpert Xpress SARS-CoV-2/FLU/RSV testing.  Fact Sheet for Patients: EntrepreneurPulse.com.au  Fact Sheet for Healthcare Providers: IncredibleEmployment.be  This test is not yet approved or cleared by the [place] FDA and has been authorized for detection and/or diagnosis of SARS-CoV-2 by FDA under an Emergency Use Authorization (EUA). This EUA will remain in effect (meaning this test can be used) for the duration of the COVID-19 declaration under Section 564(b)(1) of the Act, 21 U.S.C. section 360bbb-3(b)(1), unless the authorization is terminated or revoked.  Performed at [facility], [address], [place], [place] [address]      Time coordinating discharge: Over 30 minutes  SIGNED:   Wyvonnia Dusky, MD  [facility] 06/20/2021, 10:37 AM Pager   If 7PM-7AM, please contact night-coverage

## 2021-06-20 NOTE — Progress Notes (Signed)
Patient discharged home via wheelchair accompanied by wife.  Patient discharged with all pertinent information, prescriptions and personal belongings.  Patient able to teach back discharge instructions.  IV site d/ced.  Care relinquished.

## 2021-07-03 ENCOUNTER — Other Ambulatory Visit (HOSPITAL_COMMUNITY): Payer: Self-pay | Admitting: Unknown Physician Specialty

## 2021-07-03 ENCOUNTER — Other Ambulatory Visit: Payer: Self-pay | Admitting: Unknown Physician Specialty

## 2021-07-03 DIAGNOSIS — E041 Nontoxic single thyroid nodule: Secondary | ICD-10-CM

## 2021-11-05 ENCOUNTER — Ambulatory Visit
Admission: RE | Admit: 2021-11-05 | Discharge: 2021-11-05 | Disposition: A | Discharge status: Home / Self Care | Payer: Medicare Other | Source: Ambulatory Visit | Attending: Unknown Physician Specialty | Admitting: Unknown Physician Specialty

## 2021-11-05 ENCOUNTER — Other Ambulatory Visit: Payer: Self-pay | Admitting: Unknown Physician Specialty

## 2021-11-05 DIAGNOSIS — R221 Localized swelling, mass and lump, neck: Secondary | ICD-10-CM

## 2021-11-05 DIAGNOSIS — E079 Disorder of thyroid, unspecified: Secondary | ICD-10-CM

## 2021-11-05 MED ORDER — IOPAMIDOL (ISOVUE-300) INJECTION 61%
75.0000 mL | Freq: Once | INTRAVENOUS | Status: AC | PRN
Start: 2021-11-05 — End: 2021-11-05
  Administered 2021-11-05: 75 mL via INTRAVENOUS

## 2021-12-09 ENCOUNTER — Other Ambulatory Visit: Payer: Self-pay | Admitting: Unknown Physician Specialty

## 2021-12-09 DIAGNOSIS — E041 Nontoxic single thyroid nodule: Secondary | ICD-10-CM

## 2021-12-16 ENCOUNTER — Other Ambulatory Visit: Payer: Self-pay | Admitting: Unknown Physician Specialty

## 2021-12-16 DIAGNOSIS — E041 Nontoxic single thyroid nodule: Secondary | ICD-10-CM

## 2022-01-01 ENCOUNTER — Other Ambulatory Visit: Payer: No Typology Code available for payment source

## 2022-01-06 IMAGING — XA DG HIP (WITH PELVIS) OPERATIVE*L*
2 series · 2 of 2 positions shown · non-contrast
Comparison: None.

CLINICAL DATA: Pre and post images

EXAM:
OPERATIVE LEFT HIP (WITH PELVIS IF PERFORMED) 2 VIEWS
TECHNIQUE: Fluoroscopic spot image(s) were submitted for interpretation
post-operatively.

[Series 2: ortho standard · 1 of 1 slices shown (1 of 2)]
[im 1/1]
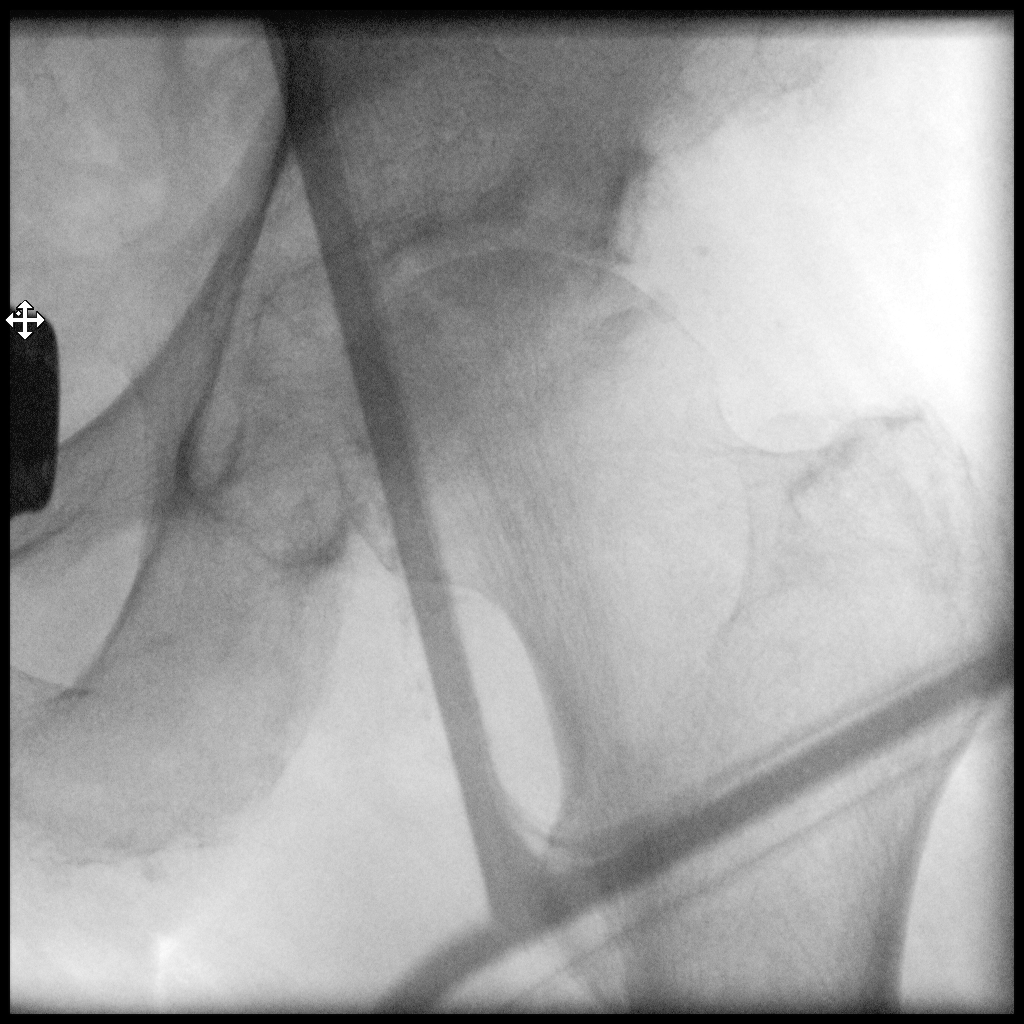

[Series 5: ortho standard · 1 of 1 slices shown (2 of 2)]
[im 1/1]
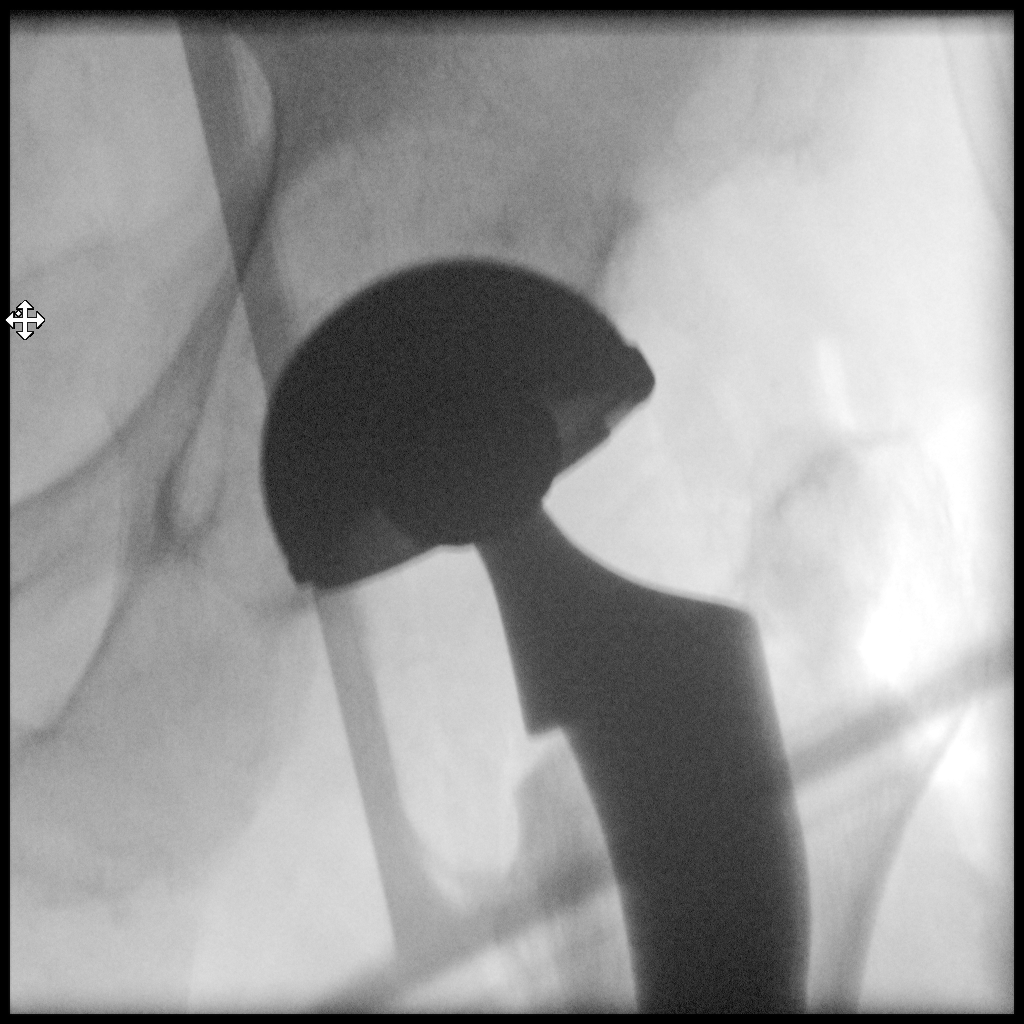

[2 of 2 positions shown; findings below may reference images not displayed]

FINDINGS: Fluoro time: 18 seconds.

Two C-arm fluoroscopic images were obtained intraoperatively and
submitted for post operative interpretation. The first images is pre
operative. The second image demonstrates left total hip
arthroplasty. No unexpected findings. Please see the performing
provider's procedural report for further detail.
IMPRESSION: Left total hip arthroplasty.

## 2022-01-06 IMAGING — DX DG HIP (WITH OR WITHOUT PELVIS) 2-3V*L*
2 series · 2 of 2 positions shown · non-contrast
Comparison: None.

CLINICAL DATA: Status post left total hip arthroplasty.

EXAM:
DG HIP (WITH OR WITHOUT PELVIS) 2-3V LEFT

[hip ap]
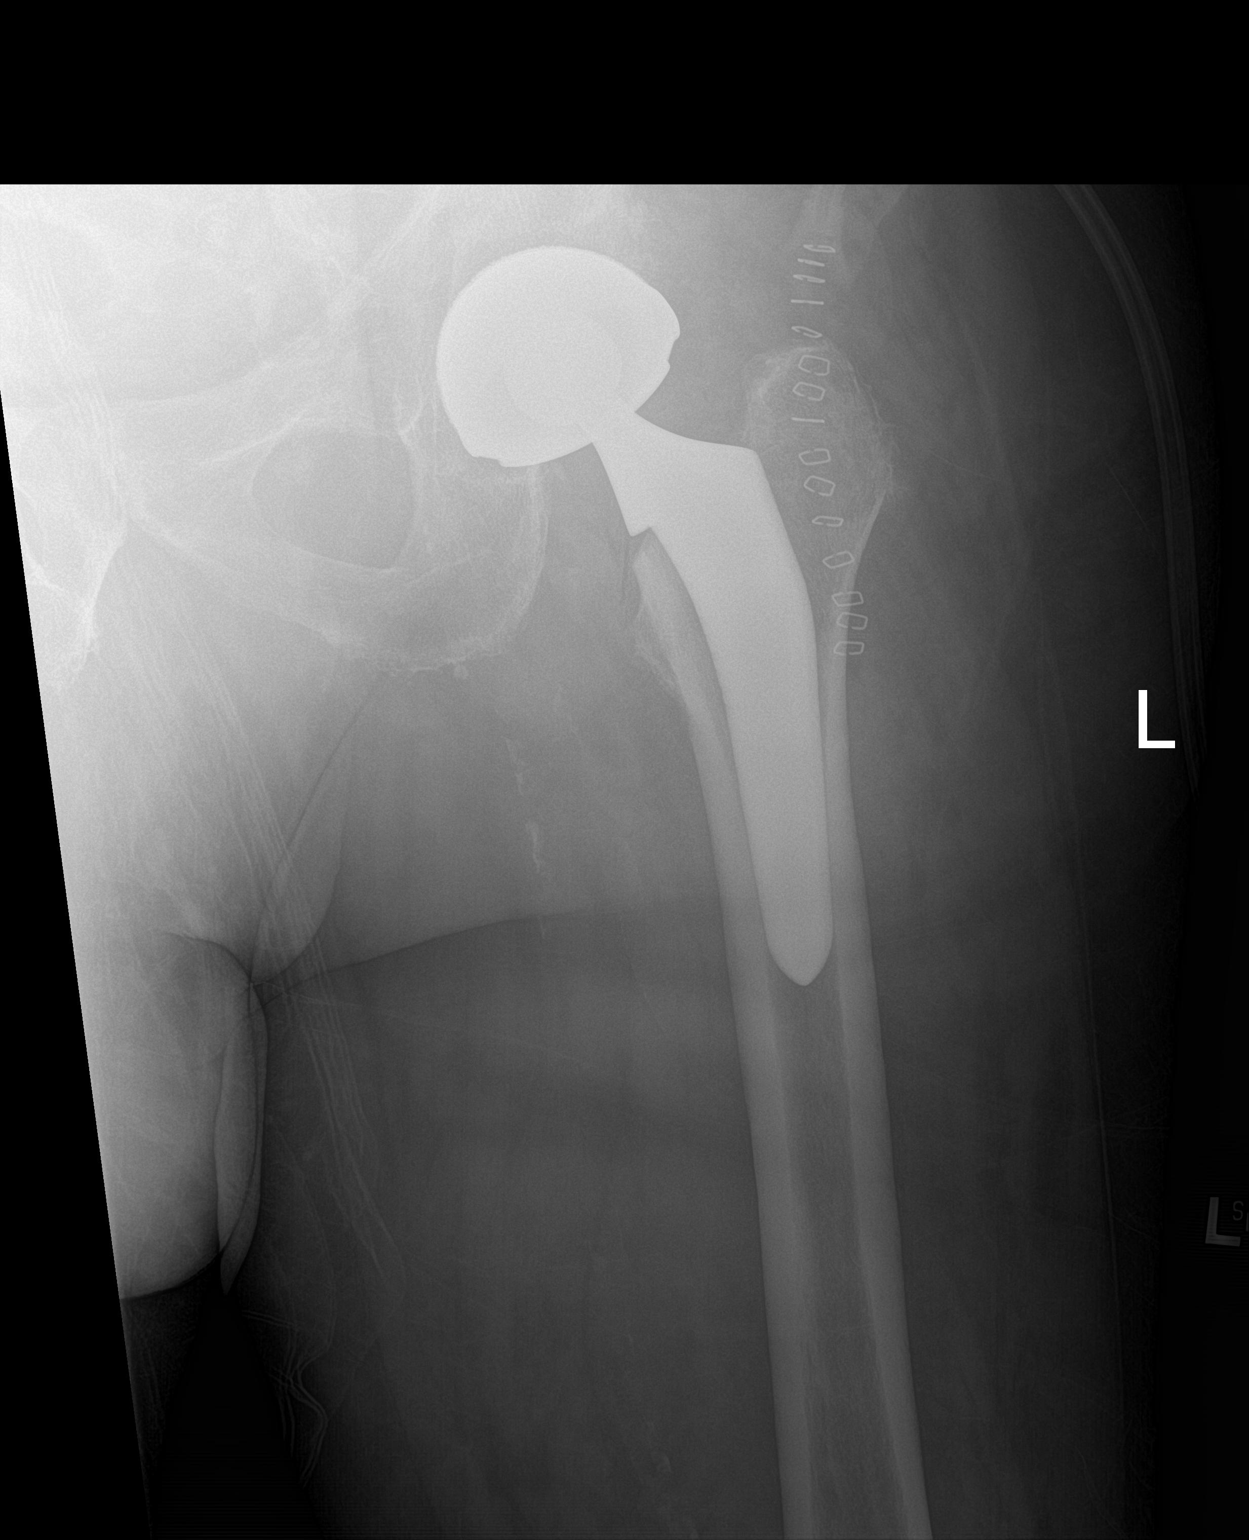

[hip lat]
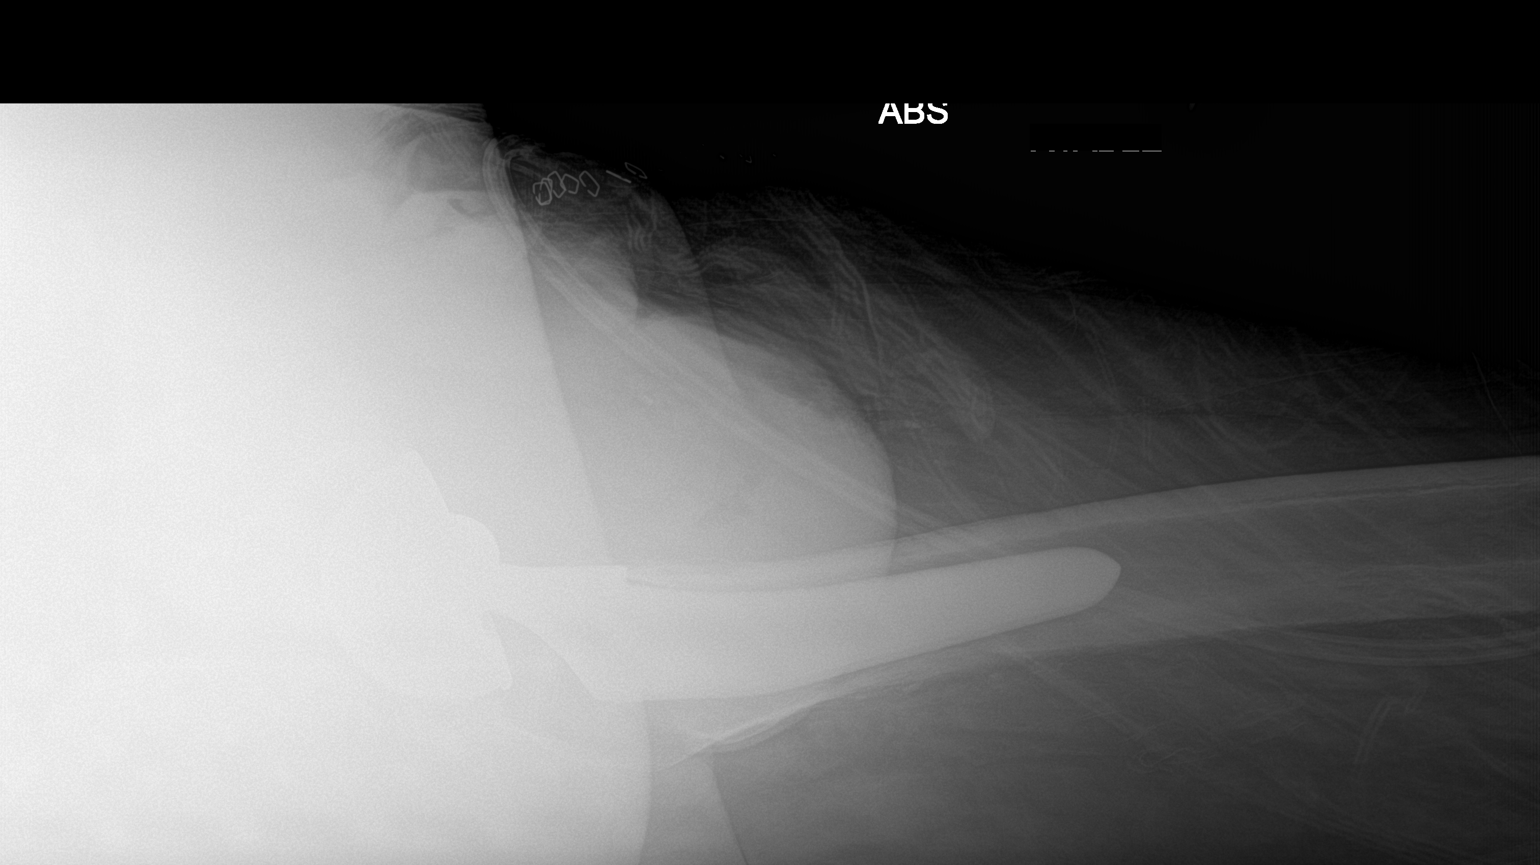

[2 of 2 positions shown; findings below may reference images not displayed]

FINDINGS: The left acetabular and femoral components are well situated.
Expected postoperative changes are seen in the surrounding soft
tissues.
IMPRESSION: Status post left total hip arthroplasty.

## 2022-03-11 ENCOUNTER — Ambulatory Visit
Admission: RE | Admit: 2022-03-11 | Discharge: 2022-03-11 | Disposition: A | Discharge status: Home / Self Care | Payer: Medicare Other | Source: Ambulatory Visit | Attending: Unknown Physician Specialty | Admitting: Unknown Physician Specialty

## 2022-03-11 DIAGNOSIS — E041 Nontoxic single thyroid nodule: Secondary | ICD-10-CM

## 2022-08-19 HISTORY — PX: KERATOPLASTY: SHX104

## 2022-10-07 ENCOUNTER — Other Ambulatory Visit: Payer: Self-pay | Admitting: Podiatry

## 2022-10-15 ENCOUNTER — Encounter
Admission: RE | Admit: 2022-10-15 | Discharge: 2022-10-15 | Disposition: A | Discharge status: Home / Self Care | Payer: Medicare Other | Source: Ambulatory Visit | Attending: Podiatry | Admitting: Podiatry

## 2022-10-15 ENCOUNTER — Encounter: Payer: Self-pay | Admitting: Podiatry

## 2022-10-15 VITALS — Ht 72.0 in | Wt 288.0 lb

## 2022-10-15 DIAGNOSIS — Z01812 Encounter for preprocedural laboratory examination: Secondary | ICD-10-CM

## 2022-10-15 DIAGNOSIS — I482 Chronic atrial fibrillation, unspecified: Secondary | ICD-10-CM

## 2022-10-15 DIAGNOSIS — I10 Essential (primary) hypertension: Secondary | ICD-10-CM

## 2022-10-15 HISTORY — DX: Essential (primary) hypertension: I10

## 2022-10-15 HISTORY — DX: Hyperlipidemia, unspecified: E78.5

## 2022-10-15 NOTE — Patient Instructions (Addendum)
Your procedure is scheduled on: Friday, February 2 Report to the Registration Desk on the 1st floor of the [facility]. To find out your arrival time, please call [phone] between 1PM - 3PM on: Thursday, February 1 If your arrival time is 6:00 am, do not arrive prior to that time as the [place] entrance doors do not open until 6:00 am.  REMEMBER: Instructions that are not followed completely may result in serious medical risk, up to and including death; or upon the discretion of your surgeon and anesthesiologist your surgery may need to be rescheduled.  Do not eat food after midnight the night before surgery.  No gum chewing, lozengers or hard candies.  You may however, drink water up to 2 hours before you are scheduled to arrive for your surgery. Do not drink anything within 2 hours of your scheduled arrival time.  In addition, your doctor has ordered for you to drink the provided  Gatorade G2 Drinking this carbohydrate drink up to two hours before surgery helps to reduce insulin resistance and improve patient outcomes. Please complete drinking 2 hours prior to scheduled arrival time.  TAKE THESE MEDICATIONS THE MORNING OF SURGERY WITH A SIP OF WATER:  Amlodipine Lamotrigine (Lamictal)  Metformin - hold for 2 days before surgery. Last day to take is Tuesday, January 30. Resume AFTER surgery.  Per Dr. [name] note: hold apixaban (Eliquis) for 3 days before surgery. Last day to take is Monday, January 29. Resume AFTER surgery per surgeon instruction.  One week prior to surgery: Stop Anti-inflammatories (NSAIDS) such as Advil, Aleve, Ibuprofen, Motrin, Naproxen, Naprosyn and Aspirin based products such as Excedrin, Goodys Powder, BC Powder. Stop ANY OVER THE COUNTER supplements until after surgery. Stop multiple vitamins You may however, continue to take Tylenol if needed for pain up until the day of surgery.  No Alcohol for 24 hours before or after surgery.  No Smoking  including e-cigarettes for 24 hours prior to surgery.  No chewable tobacco products for at least 6 hours prior to surgery.  No nicotine patches on the day of surgery.  Do not use any "recreational" drugs for at least a week prior to your surgery.  Please be advised that the combination of cocaine and anesthesia may have negative outcomes, up to and including death. If you test positive for cocaine, your surgery will be cancelled.  On the morning of surgery brush your teeth with toothpaste and water, you may rinse your mouth with mouthwash if you wish. Do not swallow any toothpaste or mouthwash.  Use CHG Soap or wipes as directed on instruction sheet.  Do not wear jewelry.  Do not wear lotions, powders, or perfumes.   Do not shave body from the neck down 48 hours prior to surgery just in case you cut yourself which could leave a site for infection.  Also, freshly shaved skin may become irritated if using the CHG soap.  Contact lenses, hearing aids and dentures may not be worn into surgery.  Do not bring valuables to the hospital. [facility] is not responsible for any missing/lost belongings or valuables.   Notify your doctor if there is any change in your medical condition (cold, fever, infection).  Wear comfortable clothing (specific to your surgery type) to the hospital.  After surgery, you can help prevent lung complications by doing breathing exercises.  Take deep breaths and cough every 1-2 hours. Your doctor may order a device called an Incentive Spirometer to help you take deep  breaths.  If you are being discharged the day of surgery, you will not be allowed to drive home. You will need a responsible adult (18 years or older) to drive you home and stay with you that night.   If you are taking public transportation, you will need to have a responsible adult (18 years or older) with you. Please confirm with your physician that it is acceptable to use public transportation.    Please call the [place] Dept. at [phone] if you have any questions about these instructions.  Surgery Visitation Policy:  Patients undergoing a surgery or procedure may have two family members or support persons with them as long as the person is not COVID-19 positive or experiencing its symptoms.      Preparing for Surgery with CHLORHEXIDINE GLUCONATE (CHG) Soap  Chlorhexidine Gluconate (CHG) Soap  o An antiseptic cleaner that kills germs and bonds with the skin to continue killing germs even after washing  o Used for showering the night before surgery and morning of surgery  Before surgery, you can play an important role by reducing the number of germs on your skin.  CHG (Chlorhexidine gluconate) soap is an antiseptic cleanser which kills germs and bonds with the skin to continue killing germs even after washing.  Please do not use if you have an allergy to CHG or antibacterial soaps. If your skin becomes reddened/irritated stop using the CHG.  1. Shower the NIGHT BEFORE SURGERY and the MORNING OF SURGERY with CHG soap.  2. If you choose to wash your hair, wash your hair first as usual with your normal shampoo.  3. After shampooing, rinse your hair and body thoroughly to remove the shampoo.  4. Use CHG as you would any other liquid soap. You can apply CHG directly to the skin and wash gently with a scrungie or a clean washcloth.  5. Apply the CHG soap to your body only from the neck down. Do not use on open wounds or open sores. Avoid contact with your eyes, ears, mouth, and genitals (private parts). Wash face and genitals (private parts) with your normal soap.  6. Wash thoroughly, paying special attention to the area where your surgery will be performed.  7. Thoroughly rinse your body with warm water.  8. Do not shower/wash with your normal soap after using and rinsing off the CHG soap.  9. Pat yourself dry with a clean towel.  10. Wear clean  pajamas to bed the night before surgery.  12. Place clean sheets on your bed the night of your first shower and do not sleep with pets.  13. Shower again with the CHG soap on the day of surgery prior to arriving at the hospital.  14. Do not apply any deodorants/lotions/powders.  15. Please wear clean clothes to the hospital.

## 2022-10-15 NOTE — Progress Notes (Signed)
Perioperative / Anesthesia Services  Pre-Admission Testing Clinical Review / Preoperative Anesthesia Consult  Date: 10/16/22  Patient Demographics:  Name: Larry Cobb DOB:   1946-02-02 MRN:   [id number]  Planned Surgical Procedure(s):    Case: [id number] Date/Time: 10/17/22 1306   Procedure: EXCISION TARSAL BONE (Right)   Anesthesia type: Choice   Pre-op diagnosis:      E11.42 - Type2 diabetes mellitus with diabetic neuropathy without long-term use of insulin     L97.511 -Ulcer of right foot, limited to breakdown of skin     E11.610 - Diabetic Charcot foot   Location: [facility] OR ROOM 02 / [place] [facility]S [facility]   Surgeons: [name], DPM   NOTE: Available PAT nursing documentation and vital signs have been reviewed. Clinical nursing staff has updated patient's PMH/PSHx, current medication list, and drug allergies/intolerances to ensure comprehensive history available to assist in medical decision making as it pertains to the aforementioned surgical procedure and anticipated anesthetic course. Extensive review of available clinical information personally performed.  PMH and PSHx updated with any diagnoses/procedures that  may have been inadvertently omitted during his intake with the pre-admission testing department's nursing staff.  Clinical Discussion:  Larry Cobb is a 77 y.o. male who is submitted for pre-surgical anesthesia review and clearance prior to him undergoing the above procedure. Patient is a Former Smoker (quit 09/1990). 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   Patient is followed by cardiology [name], MD). He was last seen in the cardiology clinic on 05/21/2022; notes reviewed. At  the time of his clinic visit, patient doing well overall from a cardiovascular perspective. He denied any acute cardiovascular symptoms/complaints. Patient denied any chest pain, shortness of breath, PND, orthopnea, palpitations, weakness, fatigue, vertiginous symptoms, or presyncope/syncope.  Patient with chronic peripheral IMA that was reported to be stable and at baseline.  Patient with a past medical history of cardiovascular diagnoses. Documented physical exam was grossly benign, providing no evidence of acute exacerbation and/or decompensation of the patient's documented cardiovascular conditions. Patient has undergone the following cardiovascular testing and/or procedures.  Long-term cardiac event monitor study performed on 09/24/2020 revealed a predominant underlying atrial fibrillation with a mean heart rate of 69 bpm; range 47-118 bpm.  There were occasional PVCs.  No patient triggered events noted.  Most recent TTE was performed on 09/26/2020 revealing a low normal left trickle systolic function with an EF of 50%.  There were no regional wall motion abnormalities.  Right ventricular size and function normal.  There was mild mitral and tricuspid valve regurgitation.  All transvalvular gradients were noted to be normal providing no evidence suggestive of valvular stenosis.  Patient with an atrial fibrillation diagnosis; CHA2DS2-VASc Score = 5 (age x 2, HTN, aortic atherosclerosis, T2DM). Patient reported to have been treated via cardiac ablation in approximately 1992.  His rate and rhythm are currently being maintained without the use of pharmacological intervention.  He is chronically anticoagulated using apixaban; reported to be compliant with therapy with no evidence or reports of GI bleeding.  Blood pressure well controlled at 124/76 mmHg on currently prescribed CCB (amlodipine) and diuretic (triamterene/HCTZ) therapies.  Patient is not on any type of lipid-lowering therapies for his HLD diagnosis  and ASCVD prevention.  T2DM well-controlled on currently prescribed regimen; last  HgbA1c 5.6% when checked on 07/18/2022.  Patient has not been confirmed to have obstructive sleep apnea, however the diagnosis is suspected.  PSG was recommended by cardiology, however patient electing to defer. Functional capacity, as defined by DASI, is documented as being >/= 4 METS.  No changes were made to his medication regimen.  Patient to follow-up with outpatient cardiology and 6 months or sooner if needed.  Larry Cobb is scheduled for an elective EXCISION RIGHT TARSAL BONE on 10/17/2022 with Dr. [name], DPM. given patient's past medical history significant for cardiovascular diagnoses, presurgical cardiac clearance was sought by the PAT team. Per cardiology, "this patient is optimized for surgery and may proceed with the planned procedural course with an ACCEPTABLE risk of significant perioperative cardiovascular complications".  Again, this patient is on daily anticoagulation therapy.  He has been instructed on recommendations from his cardiologist for holding his apixaban dose for 3 days prior to his procedure with plans to restart since postoperatively respectively minimized by his primary to the surgeon.  Patient is aware that his last dose apixaban should be on 10/13/2022.  Patient denies previous perioperative complications with anesthesia in the past. In review of the available records, it is noted that patient underwent a MAC anesthetic course at [facility] ([facility]) in 08/2022 without documented complications.      10/15/2022   11:39 AM 06/20/2021    7:44 AM 06/20/2021    5:52 AM  Vitals with BMI  Height 6' 0"     Weight 288 lbs    BMI 73.22    Systolic  025 427  Diastolic  76 86  Pulse  64 60    Providers/Specialists:   NOTE: Primary physician provider listed below. Patient may have been seen by APP or partner within same practice.   PROVIDER ROLE / SPECIALTY  LAST Montel Culver, DPM Podiatry (Surgeon) 09/30/2022  Baxter Hire, MD Primary Care Provider 07/25/2022  Isaias Cowman, MD Cardiology 05/21/2022   Allergies:  Indocin [indomethacin]  Current Home Medications:   No current facility-administered medications for this encounter.    amLODipine (NORVASC) 10 MG tablet   apixaban (ELIQUIS) 5 MG TABS tablet   ARIPiprazole (ABILIFY) 10 MG tablet   Cholecalciferol 25 MCG (1000 UT) tablet   lamoTRIgine (LAMICTAL) 150 MG tablet   metFORMIN (GLUCOPHAGE) 850 MG tablet   Multiple Vitamins-Minerals (MULTIVITAMIN WITH MINERALS) tablet   prednisoLONE acetate (PRED FORTE) 1 % ophthalmic suspension   traZODone (DESYREL) 50 MG tablet   triamterene-hydrochlorothiazide (MAXZIDE) 75-50 MG tablet   valACYclovir (VALTREX) 500 MG tablet   History:   Past Medical History:  Diagnosis Date   A-fib ([place])    a.) CHA2DS2VASc = 5 (age x 2, HTN, vascular disease history, T2DM);  b.) s/p ablation ~1992; c.) rate/rhythm maintained without pharmacological intervention; chronically anticoagulated with apixaban   Aortic atherosclerosis ([facility])    Bilateral tinnitus    Chronic lower back pain    CKD (chronic kidney disease), stage III ([place])    Diabetic Charcot foot ([facility])    Diabetic polyneuropathy ([place])    Diverticulosis    Fuchs' corneal dystrophy    GERD (gastroesophageal reflux disease)    History of cannabis abuse    History of ETOH abuse    Hypercholesteremia    Hyperlipidemia    Hypertension, essential    Melanoma of skin ([place])    Resected from his nose in 1980's   Pneumonia    PTSD (post-traumatic stress disorder)  Sleep difficulties    a.) on Trazodone qhs PRN   SNHL (sensorineural hearing loss)    Spinal stenosis    T2DM (type 2 diabetes mellitus) ([place])    Past Surgical History:  Procedure Laterality Date   AMPUTATION TOE Right 09/11/2017   Procedure: 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;  Surgeon: [name], DPM;  Location:  [facility] ORS;  Service: Podiatry;  Laterality: Right;   APPENDECTOMY  1972   CARDIAC ELECTROPHYSIOLOGY STUDY AND ABLATION  1992   CATARACT EXTRACTION Right 10/21/2016   COLONOSCOPY  05/25/2020   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   FOREIGN BODY REMOVAL     shrapnel in neck   KERATOPLASTY Right 08/19/2022   PARTIAL COLECTOMY  2006   TONSILLECTOMY Bilateral    TOTAL HIP ARTHROPLASTY Left 11/13/2020   Procedure: TOTAL HIP ARTHROPLASTY ANTERIOR APPROACH;  Surgeon: Hessie Knows, MD;  Location: [facility] ORS;  Service: Orthopedics;  Laterality: Left;   TOTAL SHOULDER REPLACEMENT Left 4132   UMBILICAL HERNIA REPAIR  2006   Family History  Problem Relation Age of Onset   Breast cancer Mother    Diabetes Mother    Social History   Tobacco Use   Smoking status: Former    Types: Cigarettes    Quit date: 1992    Years since quitting: 32.1   Smokeless tobacco: Never  Vaping Use   Vaping Use: Never used  Substance Use Topics   Alcohol use: No   Drug use: No    Pertinent Clinical Results:  LABS: Labs reviewed: Acceptable for surgery.  Lab Results  Component Value Date   WBC 7.8 06/20/2021   HGB 14.4 06/20/2021   HCT 40.7 06/20/2021   MCV 89.1 06/20/2021   PLT 178 06/20/2021   Lab Results  Component Value Date   NA 134 (L) 06/20/2021   K 3.6 06/20/2021   CO2 26 06/20/2021   GLUCOSE 119 (H) 06/20/2021   BUN 20 06/20/2021   CREATININE 1.10 06/20/2021   CALCIUM 8.7 (L) 06/20/2021   GFRNONAA >60 06/20/2021    ECG: Date: 10/17/2019 Time ECG obtained: 1140 AM Rate: 52 bpm Rhythm: atrial fibrillation Axis (leads I and aVF): Normal Intervals: QRS 104 ms. QTc 405 ms. ST segment and T wave changes: No evidence of acute ST segment elevation or depression Comparison: Similar to previous tracing obtained on 11/08/2020   IMAGING / PROCEDURES: LONG TERM CARDIAC EVENT MONITOR STUDY performed on 09/24/2020 Predominant underlying atrial fibrillation with a mean heart rate of 69 bpm;  range 47-118 bpm. Occasional PVCs  TRANSTHORACIC ECHOCARDIOGRAM performed on 09/26/2020 Low normal left ventricular systolic function with an EF of 50% Normal left ventricular diastolic Doppler parameters Normal right ventricular systolic function Mild MR/TR Normal transvalvular gradients; no valvular stenosis No pericardial effusion  Impression and Plan:  Larry Cobb has been referred for pre-anesthesia review and clearance prior to him undergoing the planned anesthetic and procedural courses. Available labs, pertinent testing, and imaging results were personally reviewed by me in preparation for upcoming operative/procedural course. [facility] medical record has been updated following extensive record review and patient interview with PAT staff.   This patient has been appropriately cleared by cardiology with an overall ACCEPTABLE risk of significant perioperative cardiovascular complications. Based on clinical review performed today (10/16/22), barring any significant acute changes in the patient's overall condition, it is anticipated that he will be able to proceed with the planned surgical intervention. Any acute changes in clinical condition may necessitate his procedure being postponed and/or cancelled. Patient  will meet with anesthesia team (MD and/or CRNA) on the day of his procedure for preoperative evaluation/assessment. Questions regarding anesthetic course will be fielded at that time.   Pre-surgical instructions were reviewed with the patient during his PAT appointment, and questions were fielded to satisfaction by PAT clinical staff. He has been instructed on which medications that he will need to hold prior to surgery, as well as the ones that have been deemed safe/appropriate to take of the day of his procedure. As part of the general education provided by PAT, patient made aware both verbally and in writing, that he will need to abstain from the use of any illegal substances  during his perioperative course.  He was advised that failure to do so could necessitate the need for case cancellation or result serious perioperative complications up to and including death. Patient encouraged to contact PAT and/or his surgeon's office to discuss any questions or concerns that may arise prior to surgery; verbalized understanding.   Honor Loh, MSN, APRN, FNP-C, CEN [facility]  Peri-operative Services Nurse Practitioner Phone: [phone] Fax: [phone] 10/16/22 12:10 PM  NOTE: This note has been prepared using Dragon dictation software. Despite my best ability to proofread, there is always the potential that unintentional transcriptional errors may still occur from this process.

## 2022-10-16 ENCOUNTER — Encounter
Admission: RE | Admit: 2022-10-16 | Discharge: 2022-10-16 | Disposition: A | Discharge status: Home / Self Care | Payer: Medicare Other | Source: Ambulatory Visit | Attending: Podiatry | Admitting: Podiatry

## 2022-10-16 DIAGNOSIS — I10 Essential (primary) hypertension: Secondary | ICD-10-CM | POA: Insufficient documentation

## 2022-10-16 DIAGNOSIS — I482 Chronic atrial fibrillation, unspecified: Secondary | ICD-10-CM | POA: Insufficient documentation

## 2022-10-16 DIAGNOSIS — Z01812 Encounter for preprocedural laboratory examination: Secondary | ICD-10-CM

## 2022-10-16 DIAGNOSIS — Z01818 Encounter for other preprocedural examination: Secondary | ICD-10-CM | POA: Insufficient documentation

## 2022-10-16 LAB — BASIC METABOLIC PANEL
Anion gap: 9 (ref 5–15)
BUN: 17 mg/dL (ref 8–23)
CO2: 27 mmol/L (ref 22–32)
Calcium: 9 mg/dL (ref 8.9–10.3)
Chloride: 95 mmol/L — ABNORMAL LOW (ref 98–111)
Creatinine, Ser: 1.12 mg/dL (ref 0.61–1.24)
GFR, Estimated: >60 mL/min (ref >60)
Glucose, Bld: 98 mg/dL (ref 70–99)
Potassium: 3.7 mmol/L (ref 3.5–5.1)
Sodium: 131 mmol/L — ABNORMAL LOW (ref 135–145)

## 2022-10-17 ENCOUNTER — Encounter
Admission: RE | Disposition: A | Discharge status: Home / Self Care | Payer: Self-pay | Source: Home / Self Care | Attending: Podiatry

## 2022-10-17 ENCOUNTER — Other Ambulatory Visit: Payer: Self-pay

## 2022-10-17 ENCOUNTER — Ambulatory Visit: Payer: Medicare Other | Admitting: Urgent Care

## 2022-10-17 ENCOUNTER — Encounter: Payer: Self-pay | Admitting: Podiatry

## 2022-10-17 ENCOUNTER — Ambulatory Visit: Payer: Medicare Other

## 2022-10-17 ENCOUNTER — Ambulatory Visit
Admission: RE | Admit: 2022-10-17 | Discharge: 2022-10-17 | Disposition: A | Discharge status: Home / Self Care | Payer: Medicare Other | Attending: Podiatry | Admitting: Podiatry

## 2022-10-17 DIAGNOSIS — M545 Low back pain, unspecified: Secondary | ICD-10-CM | POA: Diagnosis not present

## 2022-10-17 DIAGNOSIS — E11621 Type 2 diabetes mellitus with foot ulcer: Secondary | ICD-10-CM | POA: Insufficient documentation

## 2022-10-17 DIAGNOSIS — E7800 Pure hypercholesterolemia, unspecified: Secondary | ICD-10-CM | POA: Diagnosis not present

## 2022-10-17 DIAGNOSIS — M899 Disorder of bone, unspecified: Secondary | ICD-10-CM | POA: Insufficient documentation

## 2022-10-17 DIAGNOSIS — E11610 Type 2 diabetes mellitus with diabetic neuropathic arthropathy: Secondary | ICD-10-CM | POA: Insufficient documentation

## 2022-10-17 DIAGNOSIS — Z7984 Long term (current) use of oral hypoglycemic drugs: Secondary | ICD-10-CM | POA: Insufficient documentation

## 2022-10-17 DIAGNOSIS — Z85820 Personal history of malignant melanoma of skin: Secondary | ICD-10-CM | POA: Diagnosis not present

## 2022-10-17 DIAGNOSIS — E1165 Type 2 diabetes mellitus with hyperglycemia: Secondary | ICD-10-CM | POA: Insufficient documentation

## 2022-10-17 DIAGNOSIS — E1122 Type 2 diabetes mellitus with diabetic chronic kidney disease: Secondary | ICD-10-CM | POA: Diagnosis not present

## 2022-10-17 DIAGNOSIS — F419 Anxiety disorder, unspecified: Secondary | ICD-10-CM | POA: Diagnosis not present

## 2022-10-17 DIAGNOSIS — Z79899 Other long term (current) drug therapy: Secondary | ICD-10-CM | POA: Diagnosis not present

## 2022-10-17 DIAGNOSIS — L97419 Non-pressure chronic ulcer of right heel and midfoot with unspecified severity: Secondary | ICD-10-CM | POA: Insufficient documentation

## 2022-10-17 DIAGNOSIS — G709 Myoneural disorder, unspecified: Secondary | ICD-10-CM | POA: Insufficient documentation

## 2022-10-17 DIAGNOSIS — N183 Chronic kidney disease, stage 3 unspecified: Secondary | ICD-10-CM | POA: Insufficient documentation

## 2022-10-17 DIAGNOSIS — K219 Gastro-esophageal reflux disease without esophagitis: Secondary | ICD-10-CM | POA: Insufficient documentation

## 2022-10-17 DIAGNOSIS — E1142 Type 2 diabetes mellitus with diabetic polyneuropathy: Secondary | ICD-10-CM | POA: Diagnosis not present

## 2022-10-17 DIAGNOSIS — I129 Hypertensive chronic kidney disease with stage 1 through stage 4 chronic kidney disease, or unspecified chronic kidney disease: Secondary | ICD-10-CM | POA: Insufficient documentation

## 2022-10-17 DIAGNOSIS — Z7901 Long term (current) use of anticoagulants: Secondary | ICD-10-CM | POA: Insufficient documentation

## 2022-10-17 DIAGNOSIS — Z87891 Personal history of nicotine dependence: Secondary | ICD-10-CM | POA: Insufficient documentation

## 2022-10-17 DIAGNOSIS — F4310 Post-traumatic stress disorder, unspecified: Secondary | ICD-10-CM | POA: Diagnosis not present

## 2022-10-17 DIAGNOSIS — G8929 Other chronic pain: Secondary | ICD-10-CM | POA: Insufficient documentation

## 2022-10-17 DIAGNOSIS — I4891 Unspecified atrial fibrillation: Secondary | ICD-10-CM | POA: Diagnosis not present

## 2022-10-17 HISTORY — PX: BONE EXCISION: SHX6730

## 2022-10-17 HISTORY — DX: Atherosclerosis of aorta: I70.0

## 2022-10-17 HISTORY — DX: Type 2 diabetes mellitus without complications: E11.9

## 2022-10-17 HISTORY — DX: Tinnitus, bilateral: H93.13

## 2022-10-17 HISTORY — DX: Chronic kidney disease, stage 3 unspecified: N18.30

## 2022-10-17 HISTORY — DX: Alcohol abuse, in remission: F10.11

## 2022-10-17 HISTORY — DX: Unspecified sensorineural hearing loss: H90.5

## 2022-10-17 HISTORY — DX: Sleep disorder, unspecified: G47.9

## 2022-10-17 HISTORY — DX: Type 2 diabetes mellitus with diabetic neuropathic arthropathy: E11.610

## 2022-10-17 HISTORY — DX: Cannabis abuse, in remission: F12.11

## 2022-10-17 HISTORY — DX: Diverticulosis of intestine, part unspecified, without perforation or abscess without bleeding: K57.90

## 2022-10-17 HISTORY — DX: Spinal stenosis, site unspecified: M48.00

## 2022-10-17 HISTORY — DX: Type 2 diabetes mellitus with diabetic polyneuropathy: E11.42

## 2022-10-17 HISTORY — DX: Low back pain, unspecified: M54.50

## 2022-10-17 LAB — GLUCOSE, CAPILLARY
Glucose-Capillary: 98 mg/dL (ref 70–99)
Glucose-Capillary: 108 mg/dL — ABNORMAL HIGH (ref 70–99)

## 2022-10-17 SURGERY — BONE EXCISION
Anesthesia: General | Site: Foot | Laterality: Right

## 2022-10-17 MED ORDER — CEFAZOLIN SODIUM-DEXTROSE 2-4 GM/100ML-% IV SOLN
INTRAVENOUS | Status: AC
  Filled 2022-10-17: qty 100

## 2022-10-17 MED ORDER — EPHEDRINE SULFATE (PRESSORS) 50 MG/ML IJ SOLN
INTRAMUSCULAR | Status: DC | PRN
  Administered 2022-10-17: 5 mg via INTRAVENOUS

## 2022-10-17 MED ORDER — CHLORHEXIDINE GLUCONATE 0.12 % MT SOLN: 15.0000 mL | Freq: Once | OROMUCOSAL | Status: DC

## 2022-10-17 MED ORDER — PROPOFOL 500 MG/50ML IV EMUL
INTRAVENOUS | Status: DC | PRN
  Administered 2022-10-17: 50 ug/kg/min via INTRAVENOUS

## 2022-10-17 MED ORDER — SODIUM CHLORIDE 0.9 % IV SOLN: INTRAVENOUS | Status: DC

## 2022-10-17 MED ORDER — CEFAZOLIN SODIUM-DEXTROSE 1-4 GM/50ML-% IV SOLN
INTRAVENOUS | Status: DC | PRN
  Administered 2022-10-17: 1 g via INTRAVENOUS

## 2022-10-17 MED ORDER — FAMOTIDINE 20 MG PO TABS
ORAL_TABLET | ORAL | Status: AC
  Administered 2022-10-17: 20 mg
  Filled 2022-10-17: qty 1

## 2022-10-17 MED ORDER — CEFAZOLIN SODIUM-DEXTROSE 2-3 GM-%(50ML) IV SOLR
INTRAVENOUS | Status: DC | PRN
  Administered 2022-10-17: 2 g via INTRAVENOUS

## 2022-10-17 MED ORDER — LIDOCAINE HCL (CARDIAC) PF 100 MG/5ML IV SOSY
PREFILLED_SYRINGE | INTRAVENOUS | Status: DC | PRN
  Administered 2022-10-17: 80 mg via INTRAVENOUS

## 2022-10-17 MED ORDER — LIDOCAINE HCL (PF) 1 % IJ SOLN: INTRAMUSCULAR | Status: DC | PRN

## 2022-10-17 MED ORDER — BUPIVACAINE-EPINEPHRINE 0.5% -1:200000 IJ SOLN
INTRAMUSCULAR | Status: DC | PRN
  Administered 2022-10-17: 10 mL

## 2022-10-17 MED ORDER — LIDOCAINE HCL (PF) 2 % IJ SOLN
INTRAMUSCULAR | Status: AC
  Filled 2022-10-17: qty 5

## 2022-10-17 MED ORDER — CEFAZOLIN IN SODIUM CHLORIDE 3-0.9 GM/100ML-% IV SOLN
3.0000 g | INTRAVENOUS | Status: DC
  Filled 2022-10-17: qty 100

## 2022-10-17 MED ORDER — SODIUM CHLORIDE 0.9 % IV SOLN: INTRAVENOUS | Status: DC | PRN

## 2022-10-17 MED ORDER — LIDOCAINE HCL (PF) 1 % IJ SOLN
INTRAMUSCULAR | Status: AC
  Filled 2022-10-17: qty 30

## 2022-10-17 MED ORDER — BUPIVACAINE-EPINEPHRINE (PF) 0.5% -1:200000 IJ SOLN
INTRAMUSCULAR | Status: AC
  Filled 2022-10-17: qty 30

## 2022-10-17 MED ORDER — PROPOFOL 10 MG/ML IV BOLUS
INTRAVENOUS | Status: DC | PRN
  Administered 2022-10-17: 50 mg via INTRAVENOUS
  Administered 2022-10-17: 20 mg via INTRAVENOUS

## 2022-10-17 MED ORDER — ORAL CARE MOUTH RINSE: 15.0000 mL | Freq: Once | OROMUCOSAL | Status: DC

## 2022-10-17 MED ORDER — FAMOTIDINE 20 MG PO TABS: 20.0000 mg | ORAL_TABLET | Freq: Once | ORAL | Status: DC

## 2022-10-17 MED ORDER — FENTANYL CITRATE (PF) 100 MCG/2ML IJ SOLN
INTRAMUSCULAR | Status: AC
  Filled 2022-10-17: qty 2

## 2022-10-17 MED ORDER — CHLORHEXIDINE GLUCONATE 0.12 % MT SOLN
OROMUCOSAL | Status: AC
  Administered 2022-10-17: 15 mL
  Filled 2022-10-17: qty 15

## 2022-10-17 MED ORDER — FENTANYL CITRATE (PF) 100 MCG/2ML IJ SOLN
INTRAMUSCULAR | Status: DC | PRN
  Administered 2022-10-17: 25 ug via INTRAVENOUS

## 2022-10-17 SURGICAL SUPPLY — 42 items
BNDG CMPR 75X21 PLY HI ABS (MISCELLANEOUS) ×1
BNDG CMPR STD VLCR NS LF 5.8X4 (GAUZE/BANDAGES/DRESSINGS) ×1
BNDG ELASTIC 4X5.8 VLCR NS LF (GAUZE/BANDAGES/DRESSINGS) ×1 IMPLANT
BNDG ESMARCH 4 X 12 STRL LF (GAUZE/BANDAGES/DRESSINGS) ×1
BNDG ESMARCH 4X12 STRL LF (GAUZE/BANDAGES/DRESSINGS) ×1 IMPLANT
BNDG GAUZE DERMACEA FLUFF 4 (GAUZE/BANDAGES/DRESSINGS) ×1 IMPLANT
BNDG GZE 12X3 1 PLY HI ABS (GAUZE/BANDAGES/DRESSINGS) ×1
BNDG GZE DERMACEA 4 6PLY (GAUZE/BANDAGES/DRESSINGS) ×1
BNDG STRETCH GAUZE 3IN X12FT (GAUZE/BANDAGES/DRESSINGS) ×1 IMPLANT
BOOT STEPPER DURA LG (SOFTGOODS) IMPLANT
DRSG TEGADERM 2-3/8X2-3/4 SM (GAUZE/BANDAGES/DRESSINGS) IMPLANT
DURAPREP 26ML APPLICATOR (WOUND CARE) ×1 IMPLANT
ELECT REM PT RETURN 9FT ADLT (ELECTROSURGICAL) ×1
ELECTRODE REM PT RTRN 9FT ADLT (ELECTROSURGICAL) ×1 IMPLANT
GAUZE SPONGE 4X4 12PLY STRL (GAUZE/BANDAGES/DRESSINGS) ×1 IMPLANT
GAUZE STRETCH 2X75IN STRL (MISCELLANEOUS) ×1 IMPLANT
GAUZE XEROFORM 1X8 LF (GAUZE/BANDAGES/DRESSINGS) ×1 IMPLANT
GLOVE BIO SURGEON STRL SZ7.5 (GLOVE) ×1 IMPLANT
GLOVE SURG UNDER LTX SZ8 (GLOVE) ×1 IMPLANT
GOWN STRL REUS W/ TWL XL LVL3 (GOWN DISPOSABLE) ×2 IMPLANT
GOWN STRL REUS W/TWL XL LVL3 (GOWN DISPOSABLE) ×2
MANIFOLD NEPTUNE II (INSTRUMENTS) ×1 IMPLANT
NDL FILTER BLUNT 18X1 1/2 (NEEDLE) ×1 IMPLANT
NDL HYPO 25X1 1.5 SAFETY (NEEDLE) ×2 IMPLANT
NEEDLE FILTER BLUNT 18X1 1/2 (NEEDLE) ×1 IMPLANT
NEEDLE HYPO 25X1 1.5 SAFETY (NEEDLE) ×1 IMPLANT
NS IRRIG 500ML POUR BTL (IV SOLUTION) ×1 IMPLANT
PACK EXTREMITY ARMC (MISCELLANEOUS) ×1 IMPLANT
PAD ABD DERMACEA PRESS 5X9 (GAUZE/BANDAGES/DRESSINGS) IMPLANT
RASP SM TEAR CROSS CUT (RASP) IMPLANT
STOCKINETTE M/LG 89821 (MISCELLANEOUS) ×1 IMPLANT
STRIP CLOSURE SKIN 1/4X4 (GAUZE/BANDAGES/DRESSINGS) ×1 IMPLANT
SUT ETHILON 2 0 FS 18 (SUTURE) IMPLANT
SUT MNCRL 4-0 (SUTURE) ×1
SUT MNCRL 4-0 27XMFL (SUTURE) ×1
SUT VIC AB 2-0 CT2 27 (SUTURE) IMPLANT
SUT VIC AB 3-0 SH 27 (SUTURE) ×1
SUT VIC AB 3-0 SH 27X BRD (SUTURE) ×1 IMPLANT
SUTURE MNCRL 4-0 27XMF (SUTURE) ×1 IMPLANT
SWABSTK COMLB BENZOIN TINCTURE (MISCELLANEOUS) ×1 IMPLANT
SYR 10ML LL (SYRINGE) ×1 IMPLANT
TRAP FLUID SMOKE EVACUATOR (MISCELLANEOUS) ×1 IMPLANT

## 2022-10-17 NOTE — H&P (Signed)
HISTORY AND PHYSICAL INTERVAL NOTE:  10/17/2022  12:08 PM  Larry Cobb  has presented today for surgery, with the diagnosis of E11.42 - Type2 diabetes mellitus with diabetic nneuropathy without long-term use of insulin L97.511 -Ulcer of right foot, limited to breakdown of skin E11.610 - Diabetic Chrcot foot.  The various methods of treatment have been discussed with the patient.  No guarantees were given.  After consideration of risks, benefits and other options for treatment, the patient has consented to surgery.  I have reviewed the patients' chart and labs.     Cobb history and physical examination was performed in my office.  The patient was reexamined.  There have been no changes to this history and physical examination.  Larry Cobb

## 2022-10-17 NOTE — Discharge Instructions (Addendum)
AMBULATORY SURGERY  DISCHARGE INSTRUCTIONS   The drugs that you were given will stay in your system until tomorrow so for the next 24 hours you should not:  Drive an automobile Make any legal decisions Drink any alcoholic beverage   You may resume regular meals tomorrow.  Today it is better to start with liquids and gradually work up to solid foods.  You may eat anything you prefer, but it is better to start with liquids, then soup and crackers, and gradually work up to solid foods.   Please notify your doctor immediately if you have any unusual bleeding, trouble breathing, redness and pain at the surgery site, drainage, fever, or pain not relieved by medication.    Additional Instructions:        Please contact your physician with any problems or Same Day Surgery at [phone], Monday through Friday 6 am to 4 pm, or [place] at [facility] number at [phone].[name]  POST OPERATIVE INSTRUCTIONS FOR DR. Vickki Muff AND DR. [place]   Take your medication as prescribed.  Pain medication should be taken only as needed.  Keep the dressing clean, dry and intact.  Keep your foot elevated above the heart level for the first 48 hours.  We have instructed you to be non-weight bearing.  Always wear your post-op shoe when walking.  Always use your crutches if you are to be non-weight bearing.  Do not take a shower. Baths are permissible as long as the foot is kept out of the water.   Every hour you are awake:  Bend your knee 15 times.  Call [facility] [phone]) if any of the following problems occur: You develop a temperature or fever. The bandage becomes saturated with blood. Medication does not stop your pain. Injury of the foot occurs. Any symptoms of infection including redness, odor, or red streaks running from wound.

## 2022-10-17 NOTE — Anesthesia Preprocedure Evaluation (Signed)
Anesthesia Evaluation  Patient identified by MRN, date of birth, ID band Patient awake    Reviewed: Allergy & Precautions, H&P , NPO status , Patient's Chart, lab work & pertinent test results, reviewed documented beta blocker date and time   History of Anesthesia Complications Negative for: history of anesthetic complications  Airway Mallampati: I  TM Distance: >3 FB Neck ROM: full    Dental  (+) Dental Advidsory Given, Chipped   Pulmonary neg pulmonary ROS, former smoker   breath sounds clear to auscultation       Cardiovascular Exercise Tolerance: Good hypertension, Pt. on medications (-) angina (-) CAD, (-) Past MI, (-) Cardiac Stents and (-) CABG (-) dysrhythmias (-) Valvular Problems/Murmurs Rhythm:Regular Rate:Normal     Neuro/Psych neg Seizures PSYCHIATRIC DISORDERS Anxiety      Neuromuscular disease    GI/Hepatic Neg liver ROS,GERD  Controlled,,  Endo/Other  diabetes, Poorly Controlled    Renal/GU CRFRenal disease  negative genitourinary   Musculoskeletal   Abdominal   Peds negative pediatric ROS (+)  Hematology negative hematology ROS (+)   Anesthesia Other Findings Past Medical History: No date: Diabetes mellitus without complication (HCC) No date: Hypertension No date: Neuropathy No date: PTSD (post-traumatic stress disorder)  Reproductive/Obstetrics negative OB ROS                              Anesthesia Physical Anesthesia Plan  ASA: 3  Anesthesia Plan: General   Post-op Pain Management: Minimal or no pain anticipated   Induction: Intravenous  PONV Risk Score and Plan: 2 and Ondansetron and Propofol infusion  Airway Management Planned: Simple Face Mask  Additional Equipment: None  Intra-op Plan:   Post-operative Plan:   Informed Consent: I have reviewed the patients History and Physical, chart, labs and discussed the procedure including the risks, benefits  and alternatives for the proposed anesthesia with the patient or authorized representative who has indicated his/her understanding and acceptance.     Dental Advisory Given  Plan Discussed with: Anesthesiologist, CRNA and Surgeon  Anesthesia Plan Comments: (Discussed risks of anesthesia with patient, including possibility of difficulty with spontaneous ventilation under anesthesia necessitating airway intervention, PONV, and rare risks such as cardiac or respiratory or neurological events, and allergic reactions. Discussed the role of CRNA in patient's perioperative care. Patient understands.)         Anesthesia Quick Evaluation

## 2022-10-17 NOTE — Op Note (Signed)
Operative note   Surgeon:Izsak Meir Lawyer: None    Preop diagnosis: 1.  Nonhealing ulcer plantar right midfoot 2.  Exostosis plantar right midfoot    Postop diagnosis: Same    Procedure: 1.  Excision bone from metatarsal and cuboid plantar right midfoot 2.  Z-plasty local wound flap plantar right foot 3.  Intraoperative fluoroscopy without assistance of radiologist    EBL: 20 mL    Anesthesia:local and IV sedation.  Local consisted of a total of 10 cc of 0.5% bupivacaine with epinephrine    Hemostasis: [place]calf tourniquet inflated to 200 mmHg for 48 minutes    Specimen: Nonhealing tissue from ulcer plantar right midfoot    Complications: None    Operative indications:Larry Cobb is an 77 y.o. that presents today for surgical intervention.  The risks/benefits/alternatives/complications have been discussed and consent has been given.    Procedure:  Patient was brought into the OR and placed on the operating table in thesupine position. After anesthesia was obtained theright lower extremity was prepped and draped in usual sterile fashion.  Attention was directed to the plantar aspect of the right midfoot where a large full-thickness nonhealing ulcer was noted.  The ulceration measured approximately 2 cm x 1 cm and full-thickness in nature.  Full-thickness excision of the ulcer was performed.  At this time the Z-plasty incision was mapped out.  Full-thickness flaps were created along the ulcerative site.  Deep dissection was carried down to the epidermis dermal layer down to the level of bone.  At this time proliferative bone prominence was noted throughout the midfoot at about the level of the cuboid and met cuboid region.  At this time with a combination of a osteotome rongeur and power rasp I was able to remove a large amount of prominent bone that was directly beneath the ulcerative site.  Further contouring was performed at this level.  Evaluation with fluoroscopy revealed  good removal of the bone just deep to the ulcerative site.  The fluoroscopy did show other areas of exostosis to the plantar midfoot but these were removed from the ulcerative areas and these were not removed.  At this time all areas were flushed with copious amounts of irrigation.  Closure of the deep tissue was performed with a 2-0 Vicryl.  And advancement of the Z-plasty flaps with the excision of the ulcer was performed occurred and closure of the incision was performed with a 2-0 nylon.  A large bulky sterile dressing was applied.  Patient was placed in an equalizer walker boot at the end of the case.    Patient tolerated the procedure and anesthesia well.  Was transported from the OR to the PACU with all vital signs stable and vascular status intact. To be discharged per routine protocol.  Will follow up in approximately 1 week in the outpatient clinic.

## 2022-10-17 NOTE — Anesthesia Postprocedure Evaluation (Signed)
Anesthesia Post Note  Patient: ALEN MATHESON  Procedure(s) Performed: EXCISION TARSAL BONE & DEBRIDEMENT OF ULCER RIGHT FOOT (Right: Foot)  Patient location during evaluation: PACU Anesthesia Type: General Level of consciousness: awake and alert Pain management: pain level controlled Vital Signs Assessment: post-procedure vital signs reviewed and stable Respiratory status: spontaneous breathing, nonlabored ventilation, respiratory function stable and patient connected to nasal cannula oxygen Cardiovascular status: blood pressure returned to baseline and stable Postop Assessment: no apparent nausea or vomiting Anesthetic complications: no   No notable events documented.   Last Vitals:  Vitals:   10/17/22 1415 10/17/22 1430  BP: (!) 109/59 128/71  Pulse: (!) 44 (!) 40  Resp: 14 15  Temp:  (!) 36.1 C  SpO2: 95% 98%    Last Pain:  Vitals:   10/17/22 1430  TempSrc:   PainSc: 0-No pain                 Arita Miss

## 2022-10-17 NOTE — Transfer of Care (Signed)
Immediate Anesthesia Transfer of Care Note  Patient: Larry Cobb  Procedure(s) Performed: EXCISION TARSAL BONE & DEBRIDEMENT OF ULCER RIGHT FOOT (Right: Foot)  Patient Location: PACU  Anesthesia Type:MAC  Level of Consciousness: awake, alert , and oriented  Airway & Oxygen Therapy: Patient Spontanous Breathing and Patient connected to face mask oxygen  Post-op Assessment: Report given to RN and Post -op Vital signs reviewed and stable  Post vital signs: stable  Last Vitals:  Vitals Value Taken Time  BP 114/74 10/17/22 1406  Temp    Pulse 61 10/17/22 1409  Resp 15 10/17/22 1409  SpO2 100 % 10/17/22 1409  Vitals shown include unvalidated device data.  Last Pain:  Vitals:   10/17/22 1143  TempSrc: Tympanic  PainSc: 0-No pain      Patients Stated Pain Goal: 0 (76/18/48 [id number])  Complications: No notable events documented.

## 2022-10-18 ENCOUNTER — Encounter: Payer: Self-pay | Admitting: Podiatry

## 2022-10-21 LAB — SURGICAL PATHOLOGY

## 2022-12-05 ENCOUNTER — Encounter: Payer: Self-pay | Admitting: Ophthalmology

## 2022-12-15 NOTE — Discharge Instructions (Signed)

## 2022-12-16 NOTE — Anesthesia Preprocedure Evaluation (Addendum)
Anesthesia Evaluation  Patient identified by MRN, date of birth, ID band Patient awake    Airway Mallampati: III  TM Distance: <3 FB     Dental  (+) Upper Dentures, Lower Dentures   Pulmonary former smoker   breath sounds clear to auscultation       Cardiovascular hypertension, + dysrhythmias  Rhythm:Regular Rate:Normal  Hx A fib, aortic stenosis per hx, but no available echo   Neuro/Psych  PSYCHIATRIC DISORDERS Anxiety     PTSDBilateral tinnitus Sensorineural hearing loss    GI/Hepatic Neg liver ROS,GERD  ,,  Endo/Other  diabetes, Type 2, Oral Hypoglycemic Agents    Renal/GU CRFRenal disease     Musculoskeletal  (+) Arthritis , Osteoarthritis,  Diabetic charcot foot    Abdominal   Peds  Hematology negative hematology ROS (+)   Anesthesia Other Findings T2DM (type 2 diabetes mellitus) Hypertension, essential  diabetic polyneuropathy PTSD (post-traumatic stress disorder) Melanoma of skin Fuchs' corneal dystrophy Hypercholesteremia GERD (gastroesophageal reflux disease) A-fib Pneumonia  Hyperlipidemia Diabetic Charcot foot  SNHL (sensorineural hearing loss) Chronic lower back pain  Bilateral tinnitus Spinal stenosis  Aortic atherosclerosis Diverticulosis  Sleep difficulties History of ETOH abuse  History of cannabis abuse CKD (chronic kidney disease), stage III    Reproductive/Obstetrics                             Anesthesia Physical Anesthesia Plan  ASA: 3  Anesthesia Plan: MAC   Post-op Pain Management:    Induction:   PONV Risk Score and Plan:   Airway Management Planned:   Additional Equipment:   Intra-op Plan:   Post-operative Plan:   Informed Consent: I have reviewed the patients History and Physical, chart, labs and discussed the procedure including the risks, benefits and alternatives for the proposed anesthesia with the patient or authorized  representative who has indicated his/her understanding and acceptance.       Plan Discussed with: CRNA  Anesthesia Plan Comments:        Anesthesia Quick Evaluation

## 2022-12-17 ENCOUNTER — Encounter: Payer: Self-pay | Admitting: Ophthalmology

## 2022-12-17 ENCOUNTER — Other Ambulatory Visit: Payer: Self-pay

## 2022-12-17 ENCOUNTER — Encounter
Admission: RE | Disposition: A | Discharge status: Home / Self Care | Payer: Self-pay | Source: Home / Self Care | Attending: Ophthalmology

## 2022-12-17 ENCOUNTER — Ambulatory Visit
Admission: RE | Admit: 2022-12-17 | Discharge: 2022-12-17 | Disposition: A | Discharge status: Home / Self Care | Payer: Medicare Other | Attending: Ophthalmology | Admitting: Ophthalmology

## 2022-12-17 ENCOUNTER — Ambulatory Visit: Payer: Medicare Other | Admitting: Anesthesiology

## 2022-12-17 DIAGNOSIS — F4310 Post-traumatic stress disorder, unspecified: Secondary | ICD-10-CM | POA: Diagnosis not present

## 2022-12-17 DIAGNOSIS — E11610 Type 2 diabetes mellitus with diabetic neuropathic arthropathy: Secondary | ICD-10-CM | POA: Diagnosis not present

## 2022-12-17 DIAGNOSIS — I4891 Unspecified atrial fibrillation: Secondary | ICD-10-CM | POA: Diagnosis not present

## 2022-12-17 DIAGNOSIS — N183 Chronic kidney disease, stage 3 unspecified: Secondary | ICD-10-CM | POA: Diagnosis not present

## 2022-12-17 DIAGNOSIS — E1142 Type 2 diabetes mellitus with diabetic polyneuropathy: Secondary | ICD-10-CM | POA: Insufficient documentation

## 2022-12-17 DIAGNOSIS — M1990 Unspecified osteoarthritis, unspecified site: Secondary | ICD-10-CM | POA: Insufficient documentation

## 2022-12-17 DIAGNOSIS — H905 Unspecified sensorineural hearing loss: Secondary | ICD-10-CM | POA: Insufficient documentation

## 2022-12-17 DIAGNOSIS — I482 Chronic atrial fibrillation, unspecified: Secondary | ICD-10-CM

## 2022-12-17 DIAGNOSIS — I10 Essential (primary) hypertension: Secondary | ICD-10-CM

## 2022-12-17 DIAGNOSIS — I350 Nonrheumatic aortic (valve) stenosis: Secondary | ICD-10-CM | POA: Insufficient documentation

## 2022-12-17 DIAGNOSIS — H2512 Age-related nuclear cataract, left eye: Secondary | ICD-10-CM | POA: Diagnosis not present

## 2022-12-17 DIAGNOSIS — E119 Type 2 diabetes mellitus without complications: Secondary | ICD-10-CM

## 2022-12-17 DIAGNOSIS — E1136 Type 2 diabetes mellitus with diabetic cataract: Secondary | ICD-10-CM | POA: Diagnosis present

## 2022-12-17 DIAGNOSIS — Z87891 Personal history of nicotine dependence: Secondary | ICD-10-CM | POA: Diagnosis not present

## 2022-12-17 DIAGNOSIS — I129 Hypertensive chronic kidney disease with stage 1 through stage 4 chronic kidney disease, or unspecified chronic kidney disease: Secondary | ICD-10-CM | POA: Diagnosis not present

## 2022-12-17 DIAGNOSIS — Z7984 Long term (current) use of oral hypoglycemic drugs: Secondary | ICD-10-CM | POA: Insufficient documentation

## 2022-12-17 DIAGNOSIS — E1122 Type 2 diabetes mellitus with diabetic chronic kidney disease: Secondary | ICD-10-CM | POA: Diagnosis not present

## 2022-12-17 DIAGNOSIS — F419 Anxiety disorder, unspecified: Secondary | ICD-10-CM | POA: Insufficient documentation

## 2022-12-17 DIAGNOSIS — H5703 Miosis: Secondary | ICD-10-CM | POA: Insufficient documentation

## 2022-12-17 HISTORY — PX: CATARACT EXTRACTION W/PHACO: SHX586

## 2022-12-17 LAB — GLUCOSE, CAPILLARY: Glucose-Capillary: 102 mg/dL — ABNORMAL HIGH (ref 70–99)

## 2022-12-17 SURGERY — PHACOEMULSIFICATION, CATARACT, WITH IOL INSERTION
Anesthesia: Monitor Anesthesia Care | Site: Eye | Laterality: Left

## 2022-12-17 MED ORDER — FENTANYL CITRATE (PF) 100 MCG/2ML IJ SOLN
INTRAMUSCULAR | Status: DC | PRN
  Administered 2022-12-17: 50 ug via INTRAVENOUS

## 2022-12-17 MED ORDER — CEFUROXIME OPHTHALMIC INJECTION 1 MG/0.1 ML
INJECTION | OPHTHALMIC | Status: DC | PRN
  Administered 2022-12-17: .1 mL via INTRACAMERAL

## 2022-12-17 MED ORDER — SIGHTPATH DOSE#1 BSS IO SOLN
INTRAOCULAR | Status: DC | PRN
  Administered 2022-12-17: 71 mL via OPHTHALMIC

## 2022-12-17 MED ORDER — SIGHTPATH DOSE#1 BSS IO SOLN
INTRAOCULAR | Status: DC | PRN
  Administered 2022-12-17: 15 mL via INTRAOCULAR

## 2022-12-17 MED ORDER — TETRACAINE HCL 0.5 % OP SOLN
1.0000 [drp] | OPHTHALMIC | Status: DC | PRN
  Administered 2022-12-17 (×3): 1 [drp] via OPHTHALMIC

## 2022-12-17 MED ORDER — ARMC OPHTHALMIC DILATING DROPS
1.0000 | OPHTHALMIC | Status: DC | PRN
  Administered 2022-12-17 (×3): 1 via OPHTHALMIC

## 2022-12-17 MED ORDER — SIGHTPATH DOSE#1 NA HYALUR & NA CHOND-NA HYALUR IO KIT
PACK | INTRAOCULAR | Status: DC | PRN
  Administered 2022-12-17: 1 via OPHTHALMIC

## 2022-12-17 MED ORDER — GLYCOPYRROLATE 0.2 MG/ML IJ SOLN
INTRAMUSCULAR | Status: DC | PRN
  Administered 2022-12-17: .1 mg via INTRAVENOUS

## 2022-12-17 MED ORDER — MIDAZOLAM HCL 2 MG/2ML IJ SOLN
INTRAMUSCULAR | Status: DC | PRN
  Administered 2022-12-17: 2 mg via INTRAVENOUS

## 2022-12-17 MED ORDER — BRIMONIDINE TARTRATE-TIMOLOL 0.2-0.5 % OP SOLN
OPHTHALMIC | Status: DC | PRN
  Administered 2022-12-17: 1 [drp] via OPHTHALMIC

## 2022-12-17 MED ORDER — SIGHTPATH DOSE#1 BSS IO SOLN
INTRAOCULAR | Status: DC | PRN
  Administered 2022-12-17: 2 mL

## 2022-12-17 MED ORDER — NEOMYCIN-POLYMYXIN-DEXAMETH 0.1 % OP OINT
TOPICAL_OINTMENT | OPHTHALMIC | Status: DC | PRN
  Administered 2022-12-17: 1 via OPHTHALMIC

## 2022-12-17 SURGICAL SUPPLY — 21 items
CANNULA ANT/CHMB 27G (MISCELLANEOUS) IMPLANT
CANNULA ANT/CHMB 27GA (MISCELLANEOUS) IMPLANT
CATARACT SUITE SIGHTPATH (MISCELLANEOUS) ×1 IMPLANT
FEE CATARACT SUITE SIGHTPATH (MISCELLANEOUS) ×1 IMPLANT
GLOVE SRG 8 PF TXTR STRL LF DI (GLOVE) ×1 IMPLANT
GLOVE SURG ENC TEXT LTX SZ7.5 (GLOVE) ×1 IMPLANT
GLOVE SURG GAMMEX PI TX LF 7.5 (GLOVE) IMPLANT
GLOVE SURG UNDER POLY LF SZ8 (GLOVE) ×1
LENS CLAREON WAGON WHEEL 19.0 (Intraocular Lens) ×1 IMPLANT
LENS IOL CLRN WGN WHL 19.0 (Intraocular Lens) IMPLANT
NDL FILTER BLUNT 18X1 1/2 (NEEDLE) ×1 IMPLANT
NDL RETROBULBAR .5 NSTRL (NEEDLE) IMPLANT
NEEDLE FILTER BLUNT 18X1 1/2 (NEEDLE) ×1 IMPLANT
PACK VIT ANT 23G (MISCELLANEOUS) IMPLANT
RING MALYGIN 7.0 (MISCELLANEOUS) IMPLANT
SUT ETHILON 10-0 CS-B-6CS-B-6 (SUTURE)
SUT VICRYL  9 0 (SUTURE)
SUT VICRYL 9 0 (SUTURE) IMPLANT
SUTURE EHLN 10-0 CS-B-6CS-B-6 (SUTURE) IMPLANT
SYR 3ML LL SCALE MARK (SYRINGE) ×1 IMPLANT
WATER STERILE IRR 250ML POUR (IV SOLUTION) ×1 IMPLANT

## 2022-12-17 NOTE — H&P (Signed)
[place]   Primary Care Physician:  Baxter Hire, MD Ophthalmologist: Dr. Leandrew Koyanagi  Pre-Procedure History & Physical: HPI:  Larry Cobb is a 77 y.o. male here for ophthalmic surgery.   Past Medical History:  Diagnosis Date   A-fib    a.) CHA2DS2VASc = 5 (age x 2, HTN, vascular disease history, T2DM);  b.) s/p ablation ~1992; c.) rate/rhythm maintained without pharmacological intervention; chronically anticoagulated with apixaban   Aortic atherosclerosis    Bilateral tinnitus    Chronic lower back pain    CKD (chronic kidney disease), stage III    Diabetic Charcot foot    Diabetic polyneuropathy    Diverticulosis    Fuchs' corneal dystrophy    GERD (gastroesophageal reflux disease)    History of cannabis abuse    History of ETOH abuse    Hypercholesteremia    Hyperlipidemia    Hypertension, essential    Melanoma of skin    Resected from his nose in 1980's   Pneumonia    PTSD (post-traumatic stress disorder)    Sleep difficulties    a.) on Trazodone qhs PRN   SNHL (sensorineural hearing loss)    Spinal stenosis    T2DM (type 2 diabetes mellitus)     Past Surgical History:  Procedure Laterality Date   AMPUTATION TOE Right 09/11/2017   Procedure: 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;  Surgeon: [name], DPM;  Location: [facility] OR[facility];  Service: Podiatry;  Laterality: Right;   APPENDECTOMY  1972   BONE EXCISION Right 10/17/2022   Procedure: EXCISION TARSAL BONE & DEBRIDEMENT OF ULCER RIGHT FOOT;  Surgeon: [name], DPM;  Location: [facility];  Service: Podiatry;  Laterality: Right;   CARDIAC ELECTROPHYSIOLOGY STUDY AND ABLATION  1992   CATARACT EXTRACTION Right 10/21/2016   COLONOSCOPY  05/25/2020   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 Authorizing Provider  amLODipine (NORVASC) 10 MG tablet Take 10 mg by mouth daily.   Yes [provider]  apixaban (ELIQUIS) 5 MG TABS tablet Take 5 mg by mouth 2 (two) times daily.   Yes [provider]  ARIPiprazole (ABILIFY) 10 MG tablet Take 10 mg by mouth at bedtime.   Yes [provider]  Cholecalciferol 25 MCG (1000 UT) tablet Take 1,000 Units by mouth daily.   Yes [provider]  lamoTRIgine (LAMICTAL) 150 MG tablet Take 1 tablet (150 mg total) by mouth 2 (two) times daily. 09/12/17  Yes Vaughan Basta, MD  metFORMIN (GLUCOPHAGE) 850 MG tablet Take 1 tablet (850 mg total) by mouth 2 (two) times daily with a meal. Patient taking differently: Take 850 mg by mouth daily. 09/12/17  Yes Vaughan Basta, MD  Multiple Vitamins-Minerals (MULTIVITAMIN WITH MINERALS) tablet Take 1 tablet by mouth daily.   Yes [provider]  prednisoLONE acetate (PRED FORTE) 1 % ophthalmic suspension Place 1 drop into the right eye daily.   Yes [provider]  traZODone (DESYREL) 50 MG tablet Take 250 mg by mouth at  bedtime.   Yes [provider]  triamterene-hydrochlorothiazide (MAXZIDE) 75-50 MG tablet Take 1 tablet by mouth daily.   Yes [provider]  valACYclovir (VALTREX) 500 MG tablet Take 500 mg by mouth 3 (three) times daily.   Yes [provider]    Allergies as of 11/26/2022 - Review Complete 10/17/2022  Allergen Reaction Noted   Indocin [indomethacin] Other (See Comments) 03/24/2016    Family History  Problem Relation Age of Onset   Breast cancer Mother    Diabetes Mother     Social History   Socioeconomic History   Marital status:  Married    Spouse name: Janann Colonel   Number of children: 2   Years of education: Not on file   Highest education level: Not on file  Occupational History   Not on file  Tobacco Use   Smoking status: Former    Types: Cigarettes    Quit date: 1992    Years since quitting: 32.2   Smokeless tobacco: Never  Vaping Use   Vaping Use: Never used  Substance and Sexual Activity   Alcohol use: No   Drug use: No   Sexual activity: Not on file  Other Topics Concern   Not on file  Social History Narrative   Not on file   Social Determinants of Health   Financial Resource Strain: Not on file  Food Insecurity: Not on file  Transportation Needs: Not on file  Physical Activity: Not on file  Stress: Not on file  Social Connections: Not on file  Intimate Partner Violence: Not on file    Review of Systems: See HPI, otherwise negative ROS  Physical Exam: BP 127/68   Pulse 63   Temp 97.9 F (36.6 C) (Temporal)   Resp 16   Ht 6' (1.829 m)   Wt 132.2 kg   SpO2 97%   BMI 39.53 kg/m  General:   Alert,  pleasant and cooperative in NAD Head:  Normocephalic and atraumatic. Lungs:  Clear to auscultation.    Heart:  Regular rate and rhythm.   Impression/Plan: Larry Cobb is here for ophthalmic surgery.  Risks, benefits, limitations, and alternatives regarding ophthalmic surgery have been reviewed with the patient.  Questions have been answered.  All parties agreeable.   Leandrew Koyanagi, MD  12/17/2022, 8:02 AM

## 2022-12-17 NOTE — Transfer of Care (Signed)
Immediate Anesthesia Transfer of Care Note  Patient: Larry Cobb  Procedure(s) Performed: CATARACT EXTRACTION PHACO AND INTRAOCULAR LENS PLACEMENT (IOC) LEFT MALYUGIN DIABETIC 10.19 00:55.7 (Left: Eye)  Patient Location: PACU  Anesthesia Type: MAC  Level of Consciousness: awake, alert  and patient cooperative  Airway and Oxygen Therapy: Patient Spontanous Breathing and Patient connected to supplemental oxygen  Post-op Assessment: Post-op Vital signs reviewed, Patient's Cardiovascular Status Stable, Respiratory Function Stable, Patent Airway and No signs of Nausea or vomiting  Post-op Vital Signs: Reviewed and stable  Complications: No notable events documented.

## 2022-12-17 NOTE — Op Note (Signed)
OPERATIVE NOTE  Larry Cobb [id number] 12/17/2022  PREOPERATIVE DIAGNOSIS:   Nuclear sclerotic cataract left eye with miotic pupil      H25.12   POSTOPERATIVE DIAGNOSIS:   Nuclear sclerotic cataract left eye with miotic pupil.     PROCEDURE:  Phacoemulsification with posterior chamber intraocular lens implantation of the left eye which required pupil stretching with the Malyugin pupil expansion device  Ultrasound time: Procedure(s): CATARACT EXTRACTION PHACO AND INTRAOCULAR LENS PLACEMENT (IOC) LEFT MALYUGIN DIABETIC 10.19 00:55.7 (Left)  LENS:   Implant Name Type Inv. Item Serial No. Manufacturer Lot No. LRB No. Used Action  LENS CLAREON WAGON WHEEL 19.0 - [id number] Intraocular Lens LENS CLAREON WAGON WHEEL 19.0 [id number] [employer]  Left 1 Implanted     [id number]    SURGEON:  Wyonia Hough, MD   ANESTHESIA: Topical with tetracaine drops and 2% Xylocaine jelly, augmented with 1% preservative-free intracameral lidocaine.   COMPLICATIONS:  None.   DESCRIPTION OF PROCEDURE:  The patient was identified in the holding room and transported to the operating room and placed in the supine position under the operating microscope.  The left eye was identified as the operative eye and it was prepped and draped in the usual sterile ophthalmic fashion.   A 1 millimeter clear-corneal paracentesis was made at the 1:30 position.  The anterior chamber was filled with Viscoat viscoelastic.  0.5 ml of preservative-free 1% lidocaine was injected into the anterior chamber.  A 2.4 millimeter keratome was used to make a near-clear corneal incision at the 10:30 position.  A Malyugin pupil expander was then placed through the main incision and into the anterior chamber of the eye.  The edge of the iris was secured on the lip of the pupil expander and it was released, thereby expanding the pupil to approximately 7 millimeters for completion of the cataract surgery.  Additional Viscoat was placed in the  anterior chamber.  A cystotome and capsulorrhexis forceps were used to make a curvilinear capsulorrhexis.   Balanced salt solution was used to hydrodissect and hydrodelineate the lens nucleus.   Phacoemulsification was used in stop and chop fashion to remove the lens, nucleus and epinucleus.  The remaining cortex was aspirated using the irrigation aspiration handpiece.  Additional Provisc was placed into the eye to distend the capsular bag for lens placement.  A lens was then injected into the capsular bag.  The pupil expanding ring was removed using a Kuglen hook and insertion device. The remaining viscoelastic was aspirated from the capsular bag and the anterior chamber.  The anterior chamber was filled with balanced salt solution to inflate to a physiologic pressure.   Wounds were hydrated with balanced salt solution.  The anterior chamber was inflated to a physiologic pressure with balanced salt solution.  No wound leaks were noted. Cefuroxime 0.1 ml of a 10mg /ml solution was injected into the anterior chamber for a dose of 1 mg of intracameral antibiotic at the completion of the case.   Timolol and Brimonidine drops and Maxitrol ointment were applied to the eye.  The patient was taken to the recovery room in stable condition without complications of anesthesia or surgery.  [name] 12/17/2022, 8:45 AM

## 2022-12-17 NOTE — Anesthesia Postprocedure Evaluation (Signed)
Anesthesia Post Note  Patient: Larry Cobb  Procedure(s) Performed: CATARACT EXTRACTION PHACO AND INTRAOCULAR LENS PLACEMENT (IOC) LEFT MALYUGIN DIABETIC 10.19 00:55.7 (Left: Eye)  Patient location during evaluation: PACU Anesthesia Type: MAC Level of consciousness: awake and alert Pain management: pain level controlled Vital Signs Assessment: post-procedure vital signs reviewed and stable Respiratory status: spontaneous breathing, nonlabored ventilation, respiratory function stable and patient connected to nasal cannula oxygen Cardiovascular status: stable and blood pressure returned to baseline Postop Assessment: no apparent nausea or vomiting Anesthetic complications: no   No notable events documented.   Last Vitals:  Vitals:   12/17/22 0848 12/17/22 0853  BP: 116/65 113/64  Pulse: (!) 120 (!) 54  Resp: 16 13  Temp: 36.5 C 36.5 C  SpO2: 95% 97%    Last Pain:  Vitals:   12/17/22 0853  TempSrc:   PainSc: 0-No pain                 Helayne Seminole

## 2022-12-18 ENCOUNTER — Encounter: Payer: Self-pay | Admitting: Ophthalmology

## 2023-01-02 ENCOUNTER — Emergency Department: Payer: Medicare Other

## 2023-01-02 ENCOUNTER — Ambulatory Visit
Admission: EM | Admit: 2023-01-02 | Discharge: 2023-01-02 | Disposition: A | Discharge status: Home / Self Care | Payer: Medicare Other

## 2023-01-02 ENCOUNTER — Other Ambulatory Visit: Payer: Self-pay

## 2023-01-02 ENCOUNTER — Emergency Department
Admission: EM | Admit: 2023-01-02 | Discharge: 2023-01-03 | Disposition: A | Discharge status: Home / Self Care | Payer: Medicare Other | Attending: Emergency Medicine | Admitting: Emergency Medicine

## 2023-01-02 DIAGNOSIS — I482 Chronic atrial fibrillation, unspecified: Secondary | ICD-10-CM

## 2023-01-02 DIAGNOSIS — E119 Type 2 diabetes mellitus without complications: Secondary | ICD-10-CM

## 2023-01-02 DIAGNOSIS — Z7984 Long term (current) use of oral hypoglycemic drugs: Secondary | ICD-10-CM | POA: Diagnosis not present

## 2023-01-02 DIAGNOSIS — Z85820 Personal history of malignant melanoma of skin: Secondary | ICD-10-CM | POA: Insufficient documentation

## 2023-01-02 DIAGNOSIS — N183 Chronic kidney disease, stage 3 unspecified: Secondary | ICD-10-CM | POA: Insufficient documentation

## 2023-01-02 DIAGNOSIS — R600 Localized edema: Secondary | ICD-10-CM

## 2023-01-02 DIAGNOSIS — E1122 Type 2 diabetes mellitus with diabetic chronic kidney disease: Secondary | ICD-10-CM | POA: Diagnosis not present

## 2023-01-02 DIAGNOSIS — Z7901 Long term (current) use of anticoagulants: Secondary | ICD-10-CM | POA: Diagnosis not present

## 2023-01-02 DIAGNOSIS — Z79899 Other long term (current) drug therapy: Secondary | ICD-10-CM | POA: Diagnosis not present

## 2023-01-02 DIAGNOSIS — I129 Hypertensive chronic kidney disease with stage 1 through stage 4 chronic kidney disease, or unspecified chronic kidney disease: Secondary | ICD-10-CM | POA: Insufficient documentation

## 2023-01-02 DIAGNOSIS — R2241 Localized swelling, mass and lump, right lower limb: Secondary | ICD-10-CM | POA: Diagnosis present

## 2023-01-02 DIAGNOSIS — I10 Essential (primary) hypertension: Secondary | ICD-10-CM

## 2023-01-02 DIAGNOSIS — F4310 Post-traumatic stress disorder, unspecified: Secondary | ICD-10-CM

## 2023-01-02 DIAGNOSIS — M7989 Other specified soft tissue disorders: Secondary | ICD-10-CM

## 2023-01-02 LAB — CBC WITH DIFFERENTIAL/PLATELET
Abs Immature Granulocytes: 0.02 10*3/uL (ref 0.00–0.07)
Basophils Absolute: 0.1 10*3/uL (ref 0.0–0.1)
Basophils Relative: 1 %
Eosinophils Absolute: 0.3 10*3/uL (ref 0.0–0.5)
Eosinophils Relative: 5 %
HCT: 41.6 % (ref 39.0–52.0)
Hemoglobin: 14.7 g/dL (ref 13.0–17.0)
Immature Granulocytes: 0 %
Lymphocytes Relative: 35 %
Lymphs Abs: 2.3 10*3/uL (ref 0.7–4.0)
MCH: 34.3 pg — ABNORMAL HIGH (ref 26.0–34.0)
MCHC: 35.3 g/dL (ref 30.0–36.0)
MCV: 97.2 fL (ref 80.0–100.0)
Monocytes Absolute: 0.6 10*3/uL (ref 0.1–1.0)
Monocytes Relative: 9 %
Neutro Abs: 3.3 10*3/uL (ref 1.7–7.7)
Neutrophils Relative %: 50 %
Platelets: 178 10*3/uL (ref 150–400)
RBC: 4.28 MIL/uL (ref 4.22–5.81)
RDW: 11.9 % (ref 11.5–15.5)
WBC: 6.6 10*3/uL (ref 4.0–10.5)
nRBC: 0 % (ref 0.0–0.2)

## 2023-01-02 LAB — COMPREHENSIVE METABOLIC PANEL
ALT: 20 U/L (ref 0–44)
AST: 21 U/L (ref 15–41)
Albumin: 4 g/dL (ref 3.5–5.0)
Alkaline Phosphatase: 107 U/L (ref 38–126)
Anion gap: 9 (ref 5–15)
BUN: 22 mg/dL (ref 8–23)
CO2: 26 mmol/L (ref 22–32)
Calcium: 9.1 mg/dL (ref 8.9–10.3)
Chloride: 95 mmol/L — ABNORMAL LOW (ref 98–111)
Creatinine, Ser: 1.24 mg/dL (ref 0.61–1.24)
GFR, Estimated: >60 mL/min (ref >60)
Glucose, Bld: 147 mg/dL — ABNORMAL HIGH (ref 70–99)
Potassium: 3.7 mmol/L (ref 3.5–5.1)
Sodium: 130 mmol/L — ABNORMAL LOW (ref 135–145)
Total Bilirubin: 1.5 mg/dL — ABNORMAL HIGH (ref 0.3–1.2)
Total Protein: 7.7 g/dL (ref 6.5–8.1)

## 2023-01-02 NOTE — ED Provider Notes (Signed)
[facility] Provider Note    Event Date/Time   First MD Initiated Contact with Patient 01/02/23 2316     (approximate)   History   Leg Swelling   HPI  Larry CAMPUSANO is a 77 y.o. 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 Ether Griffins with podiatry who presents to the emergency department with his wife for concerns for right leg swelling that started suddenly today.  Was seen in urgent care and they sent him here to the emergency department.  He is currently being followed by dermatology for lower extremity rash that they think is psoriasis.  He did have several superficial ulcerative lesions and dermatology placed him on Keflex.  He denies any fever, leg pain, chest pain or shortness of breath, injury to the leg.  Wife does report that he recently came out of a cam walker and was wearing this for almost a year due to his Charcot foot.  No previous history of PE or DVT.  He denies any difficulty walking.   History provided by patient, wife.    Past Medical History:  Diagnosis Date   A-fib    a.) CHA2DS2VASc = 5 (age x 2, HTN, vascular disease history, T2DM);  b.) s/p ablation ~1992; c.) rate/rhythm maintained without pharmacological intervention; chronically anticoagulated with apixaban   Aortic atherosclerosis    Bilateral tinnitus    Chronic lower back pain    CKD (chronic kidney disease), stage III    Diabetic Charcot foot    Diabetic polyneuropathy    Diverticulosis    Fuchs' corneal dystrophy    GERD (gastroesophageal reflux disease)    History of cannabis abuse    History of ETOH abuse    Hypercholesteremia    Hyperlipidemia    Hypertension, essential    Melanoma of skin    Resected from his nose in 1980's   Pneumonia    PTSD (post-traumatic stress disorder)    Sleep difficulties    a.) on Trazodone qhs  PRN   SNHL (sensorineural hearing loss)    Spinal stenosis    T2DM (type 2 diabetes mellitus)     Past Surgical History:  Procedure Laterality Date   AMPUTATION TOE Right 09/11/2017   Procedure: 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;  Surgeon: Gwyneth Revels, DPM;  Location: [facility];  Service: Podiatry;  Laterality: Right;   APPENDECTOMY  1972   BONE EXCISION Right 10/17/2022   Procedure: EXCISION TARSAL BONE & DEBRIDEMENT OF ULCER RIGHT FOOT;  Surgeon: Gwyneth Revels, DPM;  Location: [facility];  Service: Podiatry;  Laterality: Right;   CARDIAC ELECTROPHYSIOLOGY STUDY AND ABLATION  1992   CATARACT EXTRACTION Right 10/21/2016   CATARACT EXTRACTION W/PHACO Left 12/17/2022   Procedure: CATARACT EXTRACTION PHACO AND INTRAOCULAR LENS PLACEMENT (IOC) LEFT MALYUGIN DIABETIC 10.19 00:55.7;  Surgeon: Lockie Mola, MD;  Location: [facility];  Service: Ophthalmology;  Laterality: Left;   COLONOSCOPY  05/25/2020   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 Authorizing Provider  amLODipine (NORVASC) 10 MG tablet Take 10 mg by mouth daily.    [provider]  apixaban (ELIQUIS) 5 MG TABS tablet Take 5 mg by mouth 2 (two) times daily.    [provider]  ARIPiprazole (ABILIFY) 10 MG tablet Take 10 mg by mouth at bedtime.    [provider]  cephALEXin (KEFLEX) 500 MG capsule Take 1,000 mg by mouth 2 (two) times daily. 12/31/22   [provider]  Cholecalciferol 25 MCG (1000 UT) tablet Take  1,000 Units by mouth daily.    [provider]  lamoTRIgine (LAMICTAL) 150 MG tablet Take 1 tablet (150 mg total) by mouth 2 (two) times daily. 09/12/17   Altamese Dilling, MD  metFORMIN (GLUCOPHAGE) 850 MG tablet Take 1 tablet (850 mg total) by mouth 2 (two) times daily with a meal. Patient taking differently: Take 850 mg by mouth daily. 09/12/17   Altamese Dilling, MD  Multiple Vitamins-Minerals (MULTIVITAMIN WITH MINERALS) tablet Take 1 tablet by mouth daily.    [provider]  prednisoLONE acetate (PRED FORTE) 1 % ophthalmic suspension Place 1 drop into the right eye daily.    [provider]  traZODone (DESYREL) 50 MG tablet Take 250 mg by mouth at bedtime.    [provider]  triamcinolone ointment (KENALOG) 0.1 % PLEASE SEE ATTACHED FOR DETAILED DIRECTIONS 12/31/22   [provider]  triamterene-hydrochlorothiazide (MAXZIDE) 75-50 MG tablet Take 1 tablet by mouth daily.    [provider]  valACYclovir (VALTREX) 500 MG tablet Take 500 mg by mouth 3 (three) times daily. Patient not taking: Reported on 01/02/2023    [provider]    Physical Exam   Triage Vital Signs: ED Triage Vitals  Enc Vitals Group     BP 01/02/23 2030 (!) 136/91     Pulse Rate 01/02/23 2030 64     Resp 01/02/23 2030 18     Temp 01/02/23 2030 98.2 F (36.8 C)     Temp Source 01/02/23 2030 Oral     SpO2 01/02/23 2030 97 %     Weight 01/02/23 2031 299 lb (135.6 kg)     Height 01/02/23 2031 6' (1.829 m)     Head Circumference --      Peak Flow --      Pain Score 01/02/23 2031 7     Pain Loc --      Pain Edu? --      Excl. in GC? --     Most recent vital signs: Vitals:   01/02/23 2030 01/03/23 0102  BP: (!) 136/91   Pulse: 64 66  Resp: 18 18  Temp: 98.2 F (36.8 C) 98.3 F (36.8 C)  SpO2: 97% 97%    CONSTITUTIONAL: Alert, responds appropriately to questions.  Elderly, afebrile, no distress HEAD: Normocephalic,  atraumatic EYES: Conjunctivae clear, pupils appear equal, sclera nonicteric ENT: normal nose; moist mucous membranes NECK: Supple, normal ROM CARD: RRR; S1 and S2 appreciated RESP: Normal chest excursion without splinting or tachypnea; breath sounds clear and equal bilaterally; no wheezes, no rhonchi, no rales, no hypoxia or respiratory distress, speaking full sentences ABD/GI: Non-distended; soft, non-tender, no rebound, no guarding, no peritoneal signs BACK: The back appears normal EXT: Nonpitting edema noted in bilateral lower extremities from about the knee down but there is increased swelling in the right leg compared to the left.  There is no calf  tenderness noted.  He has 2+ right DP pulse on exam and extremities both are warm and well-perfused.  Compartments are soft.  He has a flat erythematous macular rash noted to both distal lower extremities but also scattered to his upper extremities.  No involvement of the palms or soles.  He does have superficial ulcerated lesions noted to the distal aspect of the right leg but no surrounding warmth, drainage, induration or fluctuance.  He is status post amputation of the right second toe.  He has chronic neuropathy in both feet. SKIN: Normal color for age and race; warm, no blisters or desquamation, no petechiae or purpura, no urticaria NEURO: Moves all extremities equally, normal speech PSYCH: The patient's mood and manner are appropriate.    RIGHT lower extremity   Patient gave verbal permission to utilize photo for medical documentation only. The image was not stored on any personal device.  ED Results / Procedures / Treatments   LABS: (all labs ordered are listed, but only abnormal results are displayed) Labs Reviewed  CBC WITH DIFFERENTIAL/PLATELET - Abnormal; Notable for the following components:      Result Value   MCH 34.3 (*)    All other components within normal limits  COMPREHENSIVE METABOLIC PANEL - Abnormal; Notable for the  following components:   Sodium 130 (*)    Chloride 95 (*)    Glucose, Bld 147 (*)    Total Bilirubin 1.5 (*)    All other components within normal limits     EKG:   RADIOLOGY: My personal review and interpretation of imaging: X-rays of the lower extremity show no acute abnormality.  Ultrasound shows no DVT.  I have personally reviewed all radiology reports.   DG Tibia/Fibula Right  Result Date: 01/03/2023 CLINICAL DATA:  Right lower extremity swelling EXAM: RIGHT TIBIA AND FIBULA - 2 VIEW COMPARISON:  None Available. FINDINGS: There is no evidence of fracture or other focal bone lesions. Femorotibial chondrocalcinosis. Soft tissues are unremarkable. IMPRESSION: 1. No acute osseous abnormality. 2. Femorotibial chondrocalcinosis. Electronically Signed   By: Deatra Robinson M.D.   On: 01/03/2023 00:23   DG Foot 2 Views Right  Result Date: 01/03/2023 CLINICAL DATA:  Right lower extremity swelling EXAM: RIGHT FOOT - 2 VIEW COMPARISON:  None Available. FINDINGS: Right foot second digit resection at the mid shaft of the proximal phalanx. Diffuse soft tissue swelling with advanced midfoot osteoarthrosis. Multiple vascular calcifications. No fracture or dislocation. No focal osteolysis. IMPRESSION: Diffuse soft tissue swelling with advanced midfoot osteoarthrosis. Electronically Signed   By: Deatra Robinson M.D.   On: 01/03/2023 00:22   US Venous Img Lower Unilateral Right  Result Date: 01/03/2023 CLINICAL DATA:  Leg swelling EXAM: RIGHT LOWER EXTREMITY VENOUS DOPPLER ULTRASOUND TECHNIQUE: Gray-scale sonography with compression, as well as color and duplex ultrasound, were performed to evaluate the deep venous system(s) from the level of the common femoral vein through the popliteal and proximal calf veins. COMPARISON:  None Available. FINDINGS: VENOUS Normal compressibility of the common femoral, superficial femoral, and popliteal veins, as well as the visualized calf veins. Visualized portions of  profunda femoral vein and great saphenous vein unremarkable. No filling defects to suggest DVT on grayscale or color Doppler imaging. Doppler waveforms show normal direction of venous flow, normal respiratory plasticity and response to augmentation. Limited views of the contralateral common femoral vein are unremarkable. OTHER None. Limitations: none IMPRESSION: Negative. Electronically Signed   By: Charlett Nose M.D.   On: 01/03/2023 00:06  PROCEDURES:  Critical Care performed: No      Procedures    IMPRESSION / MDM / ASSESSMENT AND PLAN / ED COURSE  I reviewed the triage vital signs and the nursing notes.    Patient here with sudden right lower extremity swelling.  No pain.  No chest pain or shortness of breath.    DIFFERENTIAL DIAGNOSIS (includes but not limited to):   Lymphedema, cellulitis, DVT, no signs of arterial obstruction, compartment syndrome, gout, septic arthritis, fracture, CHF exacerbation   Patient's presentation is most consistent with acute complicated illness / injury requiring diagnostic workup.   PLAN: Workup initiated from triage.  No leukocytosis, normal hemoglobin.  Normal electrolytes, LFTs.  No known history of CHF and other than some increased right lower extremity swelling compared to the left, he does not appear volume overloaded.  Will obtain venous Doppler to rule out DVT.  He has no sign of arterial obstruction clinically on exam as he has strong palpable pulses.  He does have some superficial ulcerations to his leg but nothing appears acutely infected.  He has some redness but no increased warmth and no fevers and no leukocytosis.  I feel he is doing well on his Keflex and does not need this to be broadened at this time and does not need IV antibiotics or admission for infection.  Will obtain x-rays of the leg because he does have neuropathy from his diabetes and Charcot foot just to make sure there is not any bony abnormality causing symptoms today.   He denies having any pain currently.   MEDICATIONS GIVEN IN ED: Medications - No data to display   ED COURSE: X-rays reviewed and interpreted by myself and the radiologist and show degenerative changes but no acute bony abnormality.  Ultrasound also reviewed and interpreted by myself and the radiologist and shows no DVT.  Discussed with patient that this is likely lymphedema which could be reactive due to the ulcerative lesions on his legs or early infection which is already being treated with Keflex.  Recommended that he keep this leg elevated when at rest and that he can use a compression hose over-the-counter for this and decrease in sodium intake.  I do not feel he needs IV diuretics at this time.  I do not feel he needs IV antibiotics at this time.  He continues to be well-appearing, hemodynamically stable without chest pain or shortness of breath.  No indication for admission.  Patient and wife comfortable with this plan will follow-up with PCP and dermatology as an outpatient.   At this time, I do not feel there is any life-threatening condition present. I reviewed all nursing notes, vitals, pertinent previous records.  All lab and urine results, EKGs, imaging ordered have been independently reviewed and interpreted by myself.  I reviewed all available radiology reports from any imaging ordered this visit.  Based on my assessment, I feel the patient is safe to be discharged home without further emergent workup and can continue workup as an outpatient as needed. Discussed all findings, treatment plan as well as usual and customary return precautions.  They verbalize understanding and are comfortable with this plan.  Outpatient follow-up has been provided as needed.  All questions have been answered.    CONSULTS:  none   OUTSIDE RECORDS REVIEWED: Reviewed last podiatry note on 01/01/2023 and urgent care note today.       FINAL CLINICAL IMPRESSION(S) / ED DIAGNOSES   Final diagnoses:   Swelling of right lower  extremity     Rx / DC Orders   ED Discharge Orders     None        Note:  This document was prepared using Dragon voice recognition software and may include unintentional dictation errors.   Zyanne Schumm, Layla Maw, DO 01/03/23 [phone]

## 2023-01-02 NOTE — Discharge Instructions (Signed)
Go to the emergency department for your sudden onset of right lower leg swelling.

## 2023-01-02 NOTE — ED Provider Notes (Signed)
Larry Cobb    CSN: [id number] Arrival date & time: 01/02/23  1911      History   Chief Complaint Chief Complaint  Patient presents with   Wound Check    HPI Larry Cobb is a 77 y.o. male.  Accompanied by his wife, patient presents with sudden onset of swelling to his right lower leg today.  He denies fever.  He has wounds in this area that are followed by dermatology and podiatry.  He is currently on cephalexin.  He was seen by his podiatrist yesterday for post-operative follow-up, ulcer of right foot, diabetic Charcot foot, diabetic polyneuropathy.  He had been using a walking boot but stopped this 2-3 days ago.    The history is provided by the patient, medical records and the spouse.    Past Medical History:  Diagnosis Date   A-fib    a.) CHA2DS2VASc = 5 (age x 2, HTN, vascular disease history, T2DM);  b.) s/p ablation ~1992; c.) rate/rhythm maintained without pharmacological intervention; chronically anticoagulated with apixaban   Aortic atherosclerosis    Bilateral tinnitus    Chronic lower back pain    CKD (chronic kidney disease), stage III    Diabetic Charcot foot    Diabetic polyneuropathy    Diverticulosis    Fuchs' corneal dystrophy    GERD (gastroesophageal reflux disease)    History of cannabis abuse    History of ETOH abuse    Hypercholesteremia    Hyperlipidemia    Hypertension, essential    Melanoma of skin    Resected from his nose in 1980's   Pneumonia    PTSD (post-traumatic stress disorder)    Sleep difficulties    a.) on Trazodone qhs PRN   SNHL (sensorineural hearing loss)    Spinal stenosis    T2DM (type 2 diabetes mellitus)     Patient Active Problem List   Diagnosis Date Noted   Thyroid mass 06/19/2021   Thyroid mass of unclear etiology 06/18/2021   HTN (hypertension) 06/18/2021   DM2 (diabetes mellitus, type 2) 06/18/2021   Atrial fibrillation, chronic 06/18/2021   PTSD (post-traumatic stress disorder) 06/18/2021    S/P hip replacement 11/13/2020   Foot infection 09/09/2017    Past Surgical History:  Procedure Laterality Date   AMPUTATION TOE Right 09/11/2017   Procedure: 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;  Surgeon: Gwyneth Revels, DPM;  Location: [facility];  Service: Podiatry;  Laterality: Right;   APPENDECTOMY  1972   BONE EXCISION Right 10/17/2022   Procedure: EXCISION TARSAL BONE & DEBRIDEMENT OF ULCER RIGHT FOOT;  Surgeon: Gwyneth Revels, DPM;  Location: [facility];  Service: Podiatry;  Laterality: Right;   CARDIAC ELECTROPHYSIOLOGY STUDY AND ABLATION  1992   CATARACT EXTRACTION Right 10/21/2016   CATARACT EXTRACTION W/PHACO Left 12/17/2022   Procedure: CATARACT EXTRACTION PHACO AND INTRAOCULAR LENS PLACEMENT (IOC) LEFT MALYUGIN DIABETIC 10.19 00:55.7;  Surgeon: Lockie Mola, MD;  Location: [facility];  Service: Ophthalmology;  Laterality: Left;   COLONOSCOPY  05/25/2020   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 Authorizing Provider  cephALEXin (KEFLEX) 500 MG capsule Take 1,000 mg by mouth 2 (two) times daily. 12/31/22  Yes [provider]  triamcinolone ointment (KENALOG) 0.1 % PLEASE SEE ATTACHED FOR DETAILED DIRECTIONS 12/31/22  Yes [provider]  amLODipine (NORVASC) 10 MG tablet Take 10 mg by mouth daily.    [provider]  apixaban (ELIQUIS) 5 MG TABS tablet Take 5 mg by mouth 2 (two) times daily.    [provider]  ARIPiprazole (ABILIFY) 10 MG tablet Take 10  mg by mouth at bedtime.    [provider]  Cholecalciferol 25 MCG (1000 UT) tablet Take 1,000 Units by mouth daily.    [provider]  lamoTRIgine (LAMICTAL) 150 MG tablet Take 1 tablet (150 mg total) by mouth 2 (two) times daily. 09/12/17   Altamese Dilling, MD  metFORMIN (GLUCOPHAGE) 850 MG tablet Take 1 tablet (850 mg total) by mouth 2 (two) times daily with a meal. Patient taking differently: Take 850 mg by mouth daily. 09/12/17   Altamese Dilling, MD  Multiple Vitamins-Minerals (MULTIVITAMIN WITH MINERALS) tablet Take 1 tablet by mouth daily.    [provider]  prednisoLONE acetate (PRED FORTE) 1 % ophthalmic suspension Place 1 drop into the right eye daily.    [provider]  traZODone (DESYREL) 50 MG tablet Take 250 mg by mouth at bedtime.    [provider]  triamterene-hydrochlorothiazide (MAXZIDE) 75-50 MG tablet Take 1 tablet by mouth daily.    [provider]  valACYclovir (VALTREX) 500 MG tablet Take 500 mg by mouth 3 (three) times daily. Patient not taking: Reported on 01/02/2023    [provider]    Family History Family History  Problem Relation Age of Onset   Breast cancer Mother    Diabetes Mother     Social History Social History   Tobacco Use   Smoking status: Former    Types: Cigarettes    Quit date: 1992    Years since quitting: 32.3   Smokeless tobacco: Never  Vaping Use   Vaping Use: Never used  Substance Use Topics   Alcohol use: No   Drug use: No     Allergies   Indocin [indomethacin]   Review of Systems Review of Systems  Constitutional:  Negative for chills and fever.  Musculoskeletal:  Positive for arthralgias, gait problem and joint swelling.  Skin:  Positive for wound. Negative for color change.  Neurological:  Negative for weakness and numbness.     Physical Exam Triage Vital Signs ED Triage Vitals  Enc Vitals Group     BP 01/02/23 1945 121/76     Pulse  Rate 01/02/23 1937 60     Resp 01/02/23 1937 18     Temp 01/02/23 1937 97.8 F (36.6 C)     Temp src --      SpO2 01/02/23 1937 97 %     Weight --      Height --      Head Circumference --      Peak Flow --      Pain Score 01/02/23 1942 0     Pain Loc --      Pain Edu? --      Excl. in GC? --    No data found.  Updated Vital Signs BP 121/76   Pulse 60   Temp 97.8 F (36.6 C)  Resp 18   SpO2 97%   Visual Acuity Right Eye Distance:   Left Eye Distance:   Bilateral Distance:    Right Eye Near:   Left Eye Near:    Bilateral Near:     Physical Exam Vitals and nursing note reviewed.  Constitutional:      General: He is not in acute distress.    Appearance: He is well-developed. He is not ill-appearing.  HENT:     Mouth/Throat:     Mouth: Mucous membranes are moist.  Cardiovascular:     Rate and Rhythm: Normal rate and regular rhythm.     Heart sounds: Normal heart sounds.  Pulmonary:     Effort: Pulmonary effort is normal. No respiratory distress.     Breath sounds: Normal breath sounds.  Musculoskeletal:        General: Swelling and tenderness present. Normal range of motion.     Cervical back: Neck supple.     Comments: Right lower leg edematous with healing wounds present.   Skin:    General: Skin is warm and dry.     Capillary Refill: Capillary refill takes less than 2 seconds.     Findings: Lesion present.  Neurological:     Mental Status: He is alert.  Psychiatric:        Mood and Affect: Mood normal.        Behavior: Behavior normal.      UC Treatments / Results  Labs (all labs ordered are listed, but only abnormal results are displayed) Labs Reviewed - No data to display  EKG   Radiology No results found.  Procedures Procedures (including critical care time)  Medications Ordered in UC Medications - No data to display  Initial Impression / Assessment and Plan / UC Course  I have reviewed the triage vital signs and the nursing  notes.  Pertinent labs & imaging results that were available during my care of the patient were reviewed by me and considered in my medical decision making (see chart for details).   Right lower leg edema.  Patient reports that onset of right lower leg edema today.  He has healing wounds on his lower extremities that are followed by dermatology and podiatry.  He states the edema was not present yesterday.  The sudden onset of unilateral lower extremity edema, sending him to the ED for evaluation.  He is with his wife and feels stable for transport POV.  VSS.   Final Clinical Impressions(s) / UC Diagnoses   Final diagnoses:  Edema of right lower leg     Discharge Instructions      Go to the emergency department for your sudden onset of right lower leg swelling.     ED Prescriptions   None    PDMP not reviewed this encounter.   Mickie Bail, NP 01/02/23 2011

## 2023-01-02 NOTE — ED Triage Notes (Signed)
Pt reports acute onset of R leg swelling upon waking this morning and that the swelling has gotten worse throughout the day. Pt reports seen at [facility] and sent to ED for concern of cellulitis. Redness and blisters noted to RLE. Pt and spouse report that pt has been in a walking boot for a year that was removed on Tuesday. Dermatologist dx pt with psoriasis to RLE per pt report. Slight weeping noted to leg from blisters. No purulent drainage. Pt with hx of DM and neuropathy as well. Pt ambulatory to triage. Alert and oriented following commands. Breathing unlabored speaking in full sentences with symmetric chest rise and fall.

## 2023-01-02 NOTE — ED Triage Notes (Signed)
Patient to Urgent Care with wife, complaints of open psoriasis wounds and swelling present to left ankle.   Has been wearing a walking boot following a foot surgery. Was told he could stop wearing this 2-3 days ago, then his lower leg started swelling and becoming progressively more red with worsening sores/ weeping present to sores.     Has been taking new prescriptions triamcinolone ointment/ keflex prescribed by his dermatologist. Started yesterday.

## 2023-01-02 NOTE — ED Notes (Signed)
Pt taken to the room via wheelchair and attached to the monitor; primary RN made aware of the patients arrival.    

## 2023-01-03 ENCOUNTER — Emergency Department: Payer: Medicare Other

## 2023-01-03 DIAGNOSIS — R2241 Localized swelling, mass and lump, right lower limb: Secondary | ICD-10-CM | POA: Diagnosis not present

## 2023-01-03 MED ORDER — LACTATED RINGERS IV SOLN: INTRAVENOUS | Status: DC

## 2023-01-03 NOTE — Discharge Instructions (Signed)
I recommend that you keep your leg elevated is much as possible at rest.  Please continue your cephalexin as prescribed and follow-up with your PCP.  Compression stockings can also help with leg swelling and decreasing your sodium intake.  Your x-ray showed no acute bony abnormality and your ultrasound showed no blood clot.  You have no signs of significant infection on exam called cellulitis and have a strong pulse suggestive of normal arterial blood flow.  Please follow-up with your PCP if symptoms continue.

## 2024-07-20 ENCOUNTER — Encounter: Payer: Self-pay | Admitting: Internal Medicine

## 2024-07-20 ENCOUNTER — Ambulatory Visit: Payer: Self-pay

## 2024-07-20 ENCOUNTER — Encounter
Admission: RE | Disposition: A | Discharge status: Home / Self Care | Payer: Self-pay | Source: Home / Self Care | Attending: Internal Medicine

## 2024-07-20 ENCOUNTER — Ambulatory Visit
Admission: RE | Admit: 2024-07-20 | Discharge: 2024-07-20 | Disposition: A | Discharge status: Home / Self Care | Attending: Internal Medicine | Admitting: Internal Medicine

## 2024-07-20 ENCOUNTER — Other Ambulatory Visit: Payer: Self-pay

## 2024-07-20 DIAGNOSIS — D122 Benign neoplasm of ascending colon: Secondary | ICD-10-CM | POA: Diagnosis not present

## 2024-07-20 DIAGNOSIS — Z7984 Long term (current) use of oral hypoglycemic drugs: Secondary | ICD-10-CM | POA: Insufficient documentation

## 2024-07-20 DIAGNOSIS — Z87891 Personal history of nicotine dependence: Secondary | ICD-10-CM | POA: Insufficient documentation

## 2024-07-20 DIAGNOSIS — R12 Heartburn: Secondary | ICD-10-CM | POA: Diagnosis not present

## 2024-07-20 DIAGNOSIS — E66813 Obesity, class 3: Secondary | ICD-10-CM | POA: Insufficient documentation

## 2024-07-20 DIAGNOSIS — E1122 Type 2 diabetes mellitus with diabetic chronic kidney disease: Secondary | ICD-10-CM | POA: Insufficient documentation

## 2024-07-20 DIAGNOSIS — I4819 Other persistent atrial fibrillation: Secondary | ICD-10-CM | POA: Insufficient documentation

## 2024-07-20 DIAGNOSIS — Z1211 Encounter for screening for malignant neoplasm of colon: Secondary | ICD-10-CM | POA: Diagnosis present

## 2024-07-20 DIAGNOSIS — Z7901 Long term (current) use of anticoagulants: Secondary | ICD-10-CM | POA: Diagnosis not present

## 2024-07-20 DIAGNOSIS — I129 Hypertensive chronic kidney disease with stage 1 through stage 4 chronic kidney disease, or unspecified chronic kidney disease: Secondary | ICD-10-CM | POA: Insufficient documentation

## 2024-07-20 DIAGNOSIS — Z6839 Body mass index (BMI) 39.0-39.9, adult: Secondary | ICD-10-CM | POA: Diagnosis not present

## 2024-07-20 DIAGNOSIS — N183 Chronic kidney disease, stage 3 unspecified: Secondary | ICD-10-CM | POA: Insufficient documentation

## 2024-07-20 DIAGNOSIS — Z9049 Acquired absence of other specified parts of digestive tract: Secondary | ICD-10-CM | POA: Insufficient documentation

## 2024-07-20 DIAGNOSIS — K640 First degree hemorrhoids: Secondary | ICD-10-CM | POA: Insufficient documentation

## 2024-07-20 DIAGNOSIS — K219 Gastro-esophageal reflux disease without esophagitis: Secondary | ICD-10-CM | POA: Diagnosis not present

## 2024-07-20 HISTORY — PX: POLYPECTOMY: SHX149

## 2024-07-20 HISTORY — PX: COLONOSCOPY: SHX5424

## 2024-07-20 SURGERY — COLONOSCOPY
Anesthesia: General

## 2024-07-20 MED ORDER — PROPOFOL 1000 MG/100ML IV EMUL
INTRAVENOUS | Status: AC
  Filled 2024-07-20: qty 100

## 2024-07-20 MED ORDER — PROPOFOL 500 MG/50ML IV EMUL
INTRAVENOUS | Status: DC | PRN
  Administered 2024-07-20: 70 mg via INTRAVENOUS
  Administered 2024-07-20: 150 ug/kg/min via INTRAVENOUS

## 2024-07-20 MED ORDER — SODIUM CHLORIDE 0.9 % IV SOLN: INTRAVENOUS | Status: DC

## 2024-07-20 NOTE — Op Note (Signed)
 [facility] Gastroenterology Patient Name: Larry Cobb Procedure Date: 07/20/2024 10:19 AM MRN: [id number] Account #: [id number] Date of Birth: 1946/04/12 Admit Type: Outpatient Age: 78 Room: [facility] ENDO ROOM 2 Gender: Male Note Status: Finalized Instrument Name: Colon Scope [id number] Procedure:             Colonoscopy Indications:           High risk colon cancer surveillance: Personal history                         of non-advanced adenoma Providers:             Treysen Sudbeck K. Drake Wuertz MD, MD Medicines:             Propofol  per Anesthesia Complications:         No immediate complications. Estimated blood loss:                         Minimal. Procedure:             Pre-Anesthesia Assessment:                        - The risks and benefits of the procedure and the                         sedation options and risks were discussed with the                         patient. All questions were answered and informed                         consent was obtained.                        - Patient identification and proposed procedure were                         verified prior to the procedure by the nurse. The                         procedure was verified in the procedure room.                        - ASA Grade Assessment: III - A patient with severe                         systemic disease.                        - After reviewing the risks and benefits, the patient                         was deemed in satisfactory condition to undergo the                         procedure.                        After obtaining informed consent, the colonoscope was  passed under direct vision. Throughout the procedure,                         the patient's blood pressure, pulse, and oxygen                         saturations were monitored continuously. The                         Colonoscope was introduced through the anus and                         advanced to  the the cecum, identified by appendiceal                         orifice and ileocecal valve. The colonoscopy was                         performed without difficulty. The patient tolerated                         the procedure well. The quality of the bowel                         preparation was good. The ileocecal valve, appendiceal                         orifice, and rectum were photographed. Findings:      The perianal and digital rectal examinations were normal. Pertinent       negatives include normal sphincter tone and no palpable rectal lesions.      Non-bleeding internal hemorrhoids were found during retroflexion. The       hemorrhoids were Grade I (internal hemorrhoids that do not prolapse).      A 3 mm polyp was found in the proximal ascending colon. The polyp was       sessile. The polyp was removed with a piecemeal technique using a cold       biopsy forceps. Resection and retrieval were complete. Estimated blood       loss was minimal.      The exam was otherwise without abnormality. Impression:            - Non-bleeding internal hemorrhoids.                        - One 3 mm polyp in the proximal ascending colon,                         removed piecemeal using a cold biopsy forceps.                         Resected and retrieved.                        - The examination was otherwise normal. Recommendation:        - Patient has a contact number available for                         emergencies. The signs and symptoms of potential  delayed complications were discussed with the patient.                         Return to normal activities tomorrow. Written                         discharge instructions were provided to the patient.                        - Resume previous diet.                        - Continue present medications.                        - If polyps are benign or adenomatous without                         dysplasia, I will advise NO  further colonoscopy due to                         advanced age and/or severe comorbidity.                        - Return to my office PRN.                        - The findings and recommendations were discussed with                         the patient. Procedure Code(s):     --- Professional ---                        [phone], Colonoscopy, flexible; with biopsy, single or                         multiple Diagnosis Code(s):     --- Professional ---                        K64.0, First degree hemorrhoids                        D12.2, Benign neoplasm of ascending colon                        Z86.010, Personal history of colonic polyps CPT copyright 2022 [employer]. All rights reserved. The codes documented in this report are preliminary and upon coder review may  be revised to meet current compliance requirements. [name] MD, MD 07/20/2024 10:55:21 AM This report has been signed electronically. Number of Addenda: 0 Note Initiated On: 07/20/2024 10:19 AM Scope Withdrawal Time: 0 hours 11 minutes 22 seconds  Total Procedure Duration: 0 hours 16 minutes 1 second  Estimated Blood Loss:  Estimated blood loss was minimal.      [facility]

## 2024-07-20 NOTE — H&P (Signed)
 Outpatient short stay form Pre-procedure 07/20/2024 10:16 AM Janmarie Smoot K. Aundria, M.D.  Primary Physician: Norleen Rower, M.D.  Reason for visit:  Hx of adenomatous colon polyps   History of present illness:  Mr. Quest presents to the [facility] GI clinic at the request of his PCP for chief complaint of high-risk colon cancer surveillance 2/2 personal hx of adenomatous colon polyps. He presents to the clinic with his wife. Last colonoscopy performed Sept 2021 by Dr. Aundria removed one subcentimeter TA from ileocecal valve. He has hx of partial colectomy in early 2000s for large polyp in [place] . He denies any known family history of colorectal cancer, advanced adenomas, or IBD. He denies any acute GI complaints or concerns at this time. He is typically having a BM every other day currently. No issues with hematochezia, melena, fecal urgency, fecal incontinence, or abdominal pain. Appetite and diet are stable without any unintentional weight loss. He has noticed over the last 61-months more frequent issues with heartburn symptoms in the evenings after dinner. He is currently only using Pepcid  Complete OTC after getting heartburn and then symptoms subside in <30 minutes. He has had some nocturnal awakening episodes, usually after eating chocolate ice cream in the evenings before bed. He denies any complaints of esophageal dysphagia, odynophagia, early satiety, hoarseness, nausea, vomiting, or epigastric abdominal pain. He follows in Cardiology for hx of persistent atrial fibrillation on chronic anticoagulation with Eliquis . No history of GI bleeding. No other questions or concerns at this time.     Current Facility-Administered Medications:    0.9 %  sodium chloride  infusion, , Intravenous, Continuous, [place], Miral Hoopes K, MD, Last Rate: 20 mL/hr at 07/20/24 0957, New Bag at 07/20/24 0957  Medications Prior to Admission  Medication Sig Dispense Refill Last Dose/Taking   amLODipine  (NORVASC ) 10 MG tablet  Take 10 mg by mouth daily.   07/19/2024   ARIPiprazole  (ABILIFY ) 10 MG tablet Take 10 mg by mouth at bedtime.   07/19/2024   Cholecalciferol  25 MCG (1000 UT) tablet Take 1,000 Units by mouth daily.   Past Week   lamoTRIgine  (LAMICTAL ) 150 MG tablet Take 1 tablet (150 mg total) by mouth 2 (two) times daily. 60 tablet 0 07/19/2024   metFORMIN  (GLUCOPHAGE ) 850 MG tablet Take 1 tablet (850 mg total) by mouth 2 (two) times daily with a meal. (Patient taking differently: Take 850 mg by mouth daily.) 60 tablet 0 07/19/2024   Multiple Vitamins-Minerals (MULTIVITAMIN WITH MINERALS) tablet Take 1 tablet by mouth daily.   Past Week   triamterene -hydrochlorothiazide  (MAXZIDE ) 75-50 MG tablet Take 1 tablet by mouth daily.   07/19/2024   apixaban  (ELIQUIS ) 5 MG TABS tablet Take 5 mg by mouth 2 (two) times daily.   07/17/2024   cephALEXin (KEFLEX) 500 MG capsule Take 1,000 mg by mouth 2 (two) times daily.      prednisoLONE  acetate (PRED FORTE ) 1 % ophthalmic suspension Place 1 drop into the right eye daily.      traZODone  (DESYREL ) 50 MG tablet Take 250 mg by mouth at bedtime.      triamcinolone ointment (KENALOG) 0.1 % PLEASE SEE ATTACHED FOR DETAILED DIRECTIONS      valACYclovir (VALTREX) 500 MG tablet Take 500 mg by mouth 3 (three) times daily. (Patient not taking: Reported on 01/02/2023)        Allergies  Allergen Reactions   Indocin [Indomethacin] Other (See Comments)    I bleed through my skin     Past Medical History:  Diagnosis Date  A-fib (HCC)    a.) CHA2DS2VASc = 5 (age x 2, HTN, vascular disease history, T2DM);  b.) s/p ablation ~1992; c.) rate/rhythm maintained without pharmacological intervention; chronically anticoagulated with apixaban    Aortic atherosclerosis    Bilateral tinnitus    Chronic lower back pain    CKD (chronic kidney disease), stage III (HCC)    Diabetic Charcot foot (HCC)    Diabetic polyneuropathy (HCC)    Diverticulosis    Fuchs' corneal dystrophy    GERD  (gastroesophageal reflux disease)    History of cannabis abuse    History of ETOH abuse    Hypercholesteremia    Hyperlipidemia    Hypertension, essential    Melanoma of skin (HCC)    Resected from his nose in 1980's   Pneumonia    PTSD (post-traumatic stress disorder)    Sleep difficulties    a.) on Trazodone  qhs PRN   SNHL (sensorineural hearing loss)    Spinal stenosis    T2DM (type 2 diabetes mellitus) (HCC)     Review of systems:  Otherwise negative.    Physical Exam  Gen: Alert, oriented. Appears stated age.  HEENT: [place]/AT. PERRLA. Lungs: CTA, no wheezes. CV: RR nl S1, S2. Abd: soft, benign, no masses. BS+ Ext: No edema. Pulses 2+    Planned procedures: Proceed with colonoscopy. The patient understands the nature of the planned procedure, indications, risks, alternatives and potential complications including but not limited to bleeding, infection, perforation, damage to internal organs and possible oversedation/side effects from anesthesia. The patient agrees and gives consent to proceed.  Please refer to procedure notes for findings, recommendations and patient disposition/instructions.     Shterna Laramee K. Aundria, M.D. Gastroenterology 07/20/2024  10:16 AM

## 2024-07-20 NOTE — Anesthesia Preprocedure Evaluation (Addendum)
 Anesthesia Evaluation  Patient identified by MRN, date of birth, ID band Patient awake    Reviewed: Allergy & Precautions, NPO status , Patient's Chart, lab work & pertinent test results  Airway Mallampati: II  TM Distance: >3 FB Neck ROM: Full    Dental  (+) Teeth Intact, Caps   Pulmonary Patient abstained from smoking., former smoker   Pulmonary exam normal breath sounds clear to auscultation       Cardiovascular Exercise Tolerance: Good hypertension, Pt. on medications Normal cardiovascular exam Rhythm:Irregular     Neuro/Psych   Anxiety     negative neurological ROS  negative psych ROS   GI/Hepatic negative GI ROS, Neg liver ROS,GERD  Medicated,,  Endo/Other  diabetes, Type 2, Oral Hypoglycemic Agents  Class 3 obesity  Renal/GU CRFRenal disease  negative genitourinary   Musculoskeletal  (+) Arthritis ,    Abdominal  (+) + obese  Peds negative pediatric ROS (+)  Hematology negative hematology ROS (+)   Anesthesia Other Findings Past Medical History: No date: A-fib [facility])     Comment:  a.) CHA2DS2VASc = 5 (age x 2, HTN, vascular disease               history, T2DM);  b.) s/p ablation ~1992; c.) rate/rhythm               maintained without pharmacological intervention;               chronically anticoagulated with apixaban  No date: Aortic atherosclerosis No date: Bilateral tinnitus No date: Chronic lower back pain No date: CKD (chronic kidney disease), stage III (HCC) No date: Diabetic Charcot foot (HCC) No date: Diabetic polyneuropathy (HCC) No date: Diverticulosis No date: Fuchs' corneal dystrophy No date: GERD (gastroesophageal reflux disease) No date: History of cannabis abuse No date: History of ETOH abuse No date: Hypercholesteremia No date: Hyperlipidemia No date: Hypertension, essential No date: Melanoma of skin (HCC)     Comment:  Resected from his nose in 1980's No date: Pneumonia No date:  PTSD (post-traumatic stress disorder) No date: Sleep difficulties     Comment:  a.) on Trazodone  qhs PRN No date: SNHL (sensorineural hearing loss) No date: Spinal stenosis No date: T2DM (type 2 diabetes mellitus) (HCC)  Past Surgical History: 09/11/2017: AMPUTATION TOE; Right     Comment:  Procedure: 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;  Surgeon:               Ashley Soulier, DPM;  Location: [facility] ORS;  Service:               Podiatry;  Laterality: Right; 1972: APPENDECTOMY 10/17/2022: BONE EXCISION; Right     Comment:  Procedure: EXCISION TARSAL BONE & DEBRIDEMENT OF ULCER               RIGHT FOOT;  Surgeon: Ashley Soulier, DPM;  Location:               [facility] ORS;  Service: Podiatry;  Laterality: Right; 1992: CARDIAC ELECTROPHYSIOLOGY STUDY AND ABLATION 10/21/2016: CATARACT EXTRACTION; Right 12/17/2022: CATARACT EXTRACTION W/PHACO; Left     Comment:  Procedure: CATARACT EXTRACTION PHACO AND INTRAOCULAR               LENS PLACEMENT (IOC) LEFT MALYUGIN DIABETIC 10.19               00:55.7;  Surgeon: Mittie Gaskin, MD;  Location:               [facility];  Service: Ophthalmology;                Laterality: Left; 05/25/2020: COLONOSCOPY 10/21/2016: CORNEAL TRANSPLANT; Right No date: FOREIGN BODY REMOVAL     Comment:  shrapnel in neck 08/19/2022: KERATOPLASTY; Right 2006: PARTIAL COLECTOMY No date: TONSILLECTOMY; Bilateral 11/13/2020: TOTAL HIP ARTHROPLASTY; Left     Comment:  Procedure: TOTAL HIP ARTHROPLASTY ANTERIOR APPROACH;                Surgeon: Kathlynn Sharper, MD;  Location: [facility] ORS;                Service: Orthopedics;  Laterality: Left; 2010: TOTAL SHOULDER REPLACEMENT; Left 2006: UMBILICAL HERNIA REPAIR  BMI    Body Mass Index: 39.63 kg/m      Reproductive/Obstetrics negative OB ROS                              Anesthesia Physical Anesthesia Plan  ASA: 3  Anesthesia Plan: General   Post-op Pain Management:    Induction:  Intravenous  PONV Risk Score and Plan: Propofol  infusion and TIVA  Airway Management Planned: Natural Airway and Nasal Cannula  Additional Equipment:   Intra-op Plan:   Post-operative Plan:   Informed Consent: I have reviewed the patients History and Physical, chart, labs and discussed the procedure including the risks, benefits and alternatives for the proposed anesthesia with the patient or authorized representative who has indicated his/her understanding and acceptance.     Dental Advisory Given  Plan Discussed with: CRNA  Anesthesia Plan Comments:          Anesthesia Quick Evaluation

## 2024-07-20 NOTE — Transfer of Care (Signed)
 Immediate Anesthesia Transfer of Care Note  Patient: Larry Cobb  Procedure(s) Performed: COLONOSCOPY POLYPECTOMY, INTESTINE  Patient Location: Endoscopy Unit  Anesthesia Type:General  Level of Consciousness: awake  Airway & Oxygen Therapy: Patient Spontanous Breathing  Post-op Assessment: Report given to RN and Post -op Vital signs reviewed and stable  Post vital signs: Reviewed and stable  Last Vitals:  Vitals Value Taken Time  BP    Temp 35.8 C 07/20/24 10:56  Pulse    Resp    SpO2      Last Pain:  Vitals:   07/20/24 1056  TempSrc: Temporal  PainSc: 0-No pain         Complications: There were no known notable events for this encounter.

## 2024-07-20 NOTE — Interval H&P Note (Signed)
 History and Physical Interval Note:  07/20/2024 10:17 AM  Larry Cobb  has presented today for surgery, with the diagnosis of h/TA Polyps.  The various methods of treatment have been discussed with the patient and family. After consideration of risks, benefits and other options for treatment, the patient has consented to  Procedure(s): COLONOSCOPY (N/A) as a surgical intervention.  The patient's history has been reviewed, patient examined, no change in status, stable for surgery.  I have reviewed the patient's chart and labs.  Questions were answered to the patient's satisfaction.     [place], Disney Ruggiero

## 2024-07-20 NOTE — Anesthesia Postprocedure Evaluation (Signed)
 Anesthesia Post Note  Patient: Larry Cobb  Procedure(s) Performed: COLONOSCOPY POLYPECTOMY, INTESTINE  Patient location during evaluation: PACU Anesthesia Type: General Level of consciousness: awake Pain management: pain level controlled Vital Signs Assessment: post-procedure vital signs reviewed and stable Respiratory status: spontaneous breathing Cardiovascular status: blood pressure returned to baseline Anesthetic complications: no   There were no known notable events for this encounter.   Last Vitals:  Vitals:   07/20/24 1116 07/20/24 1117  BP: (!) 99/54 101/65  Pulse:  60  Resp: 15 15  Temp:    SpO2:  99%    Last Pain:  Vitals:   07/20/24 1115  TempSrc:   PainSc: 0-No pain                 VAN STAVEREN,Martavia Tye

## 2024-07-22 LAB — SURGICAL PATHOLOGY
